# Patient Record
Sex: Male | Born: 1940 | Race: White | Hispanic: No | Marital: Single | Smoking: Former smoker
Health system: Southern US, Community
[De-identification: ages and names within clinical notes are randomized; demographics above are authoritative.]

## PROBLEM LIST (undated history)

## (undated) DIAGNOSIS — L719 Rosacea, unspecified: Secondary | ICD-10-CM

## (undated) DIAGNOSIS — C439 Malignant melanoma of skin, unspecified: Secondary | ICD-10-CM

## (undated) DIAGNOSIS — E7800 Pure hypercholesterolemia, unspecified: Secondary | ICD-10-CM

## (undated) DIAGNOSIS — M1990 Unspecified osteoarthritis, unspecified site: Secondary | ICD-10-CM

## (undated) DIAGNOSIS — I2510 Atherosclerotic heart disease of native coronary artery without angina pectoris: Secondary | ICD-10-CM

## (undated) HISTORY — PX: COLONOSCOPY: SHX174

## (undated) HISTORY — DX: Malignant melanoma of skin, unspecified: C43.9

## (undated) HISTORY — PX: OTHER SURGICAL HISTORY: SHX169

## (undated) HISTORY — PX: TONSILLECTOMY: SUR1361

## (undated) HISTORY — DX: Atherosclerotic heart disease of native coronary artery without angina pectoris: I25.10

## (undated) HISTORY — DX: Pure hypercholesterolemia, unspecified: E78.00

## (undated) HISTORY — DX: Unspecified osteoarthritis, unspecified site: M19.90

## (undated) HISTORY — DX: Rosacea, unspecified: L71.9

---

## 1998-05-20 HISTORY — PX: CORONARY ANGIOPLASTY WITH STENT PLACEMENT: SHX49

## 1998-11-17 ENCOUNTER — Observation Stay (HOSPITAL_COMMUNITY): Admission: AD | Admit: 1998-11-17 | Discharge: 1998-11-18 | Payer: Self-pay | Admitting: Cardiology

## 1998-12-19 ENCOUNTER — Encounter (HOSPITAL_COMMUNITY): Admission: RE | Admit: 1998-12-19 | Discharge: 1999-03-19 | Payer: Self-pay | Admitting: Cardiology

## 1999-08-10 ENCOUNTER — Encounter: Payer: Self-pay | Admitting: Family Medicine

## 1999-08-10 ENCOUNTER — Encounter: Admission: RE | Admit: 1999-08-10 | Discharge: 1999-08-10 | Payer: Self-pay | Admitting: Family Medicine

## 2001-08-08 ENCOUNTER — Inpatient Hospital Stay (HOSPITAL_COMMUNITY): Admission: EM | Admit: 2001-08-08 | Discharge: 2001-08-11 | Payer: Self-pay

## 2001-08-11 ENCOUNTER — Encounter: Payer: Self-pay | Admitting: Cardiology

## 2001-09-08 ENCOUNTER — Encounter: Admission: RE | Admit: 2001-09-08 | Discharge: 2001-09-08 | Payer: Self-pay | Admitting: Family Medicine

## 2001-09-08 ENCOUNTER — Encounter: Payer: Self-pay | Admitting: Family Medicine

## 2007-05-21 DIAGNOSIS — C439 Malignant melanoma of skin, unspecified: Secondary | ICD-10-CM

## 2007-05-21 HISTORY — DX: Malignant melanoma of skin, unspecified: C43.9

## 2011-06-24 DIAGNOSIS — D485 Neoplasm of uncertain behavior of skin: Secondary | ICD-10-CM | POA: Diagnosis not present

## 2011-06-24 DIAGNOSIS — L570 Actinic keratosis: Secondary | ICD-10-CM | POA: Diagnosis not present

## 2011-06-24 DIAGNOSIS — D239 Other benign neoplasm of skin, unspecified: Secondary | ICD-10-CM | POA: Diagnosis not present

## 2011-06-24 DIAGNOSIS — D2360 Other benign neoplasm of skin of unspecified upper limb, including shoulder: Secondary | ICD-10-CM | POA: Diagnosis not present

## 2011-06-24 DIAGNOSIS — Z85820 Personal history of malignant melanoma of skin: Secondary | ICD-10-CM | POA: Diagnosis not present

## 2011-11-18 DIAGNOSIS — Z87442 Personal history of urinary calculi: Secondary | ICD-10-CM | POA: Diagnosis not present

## 2011-11-18 DIAGNOSIS — N529 Male erectile dysfunction, unspecified: Secondary | ICD-10-CM | POA: Diagnosis not present

## 2011-11-18 DIAGNOSIS — N401 Enlarged prostate with lower urinary tract symptoms: Secondary | ICD-10-CM | POA: Diagnosis not present

## 2011-11-18 DIAGNOSIS — N486 Induration penis plastica: Secondary | ICD-10-CM | POA: Diagnosis not present

## 2011-12-13 DIAGNOSIS — C44320 Squamous cell carcinoma of skin of unspecified parts of face: Secondary | ICD-10-CM | POA: Diagnosis not present

## 2011-12-13 DIAGNOSIS — D0439 Carcinoma in situ of skin of other parts of face: Secondary | ICD-10-CM | POA: Diagnosis not present

## 2011-12-13 DIAGNOSIS — Z85828 Personal history of other malignant neoplasm of skin: Secondary | ICD-10-CM | POA: Diagnosis not present

## 2011-12-13 DIAGNOSIS — L821 Other seborrheic keratosis: Secondary | ICD-10-CM | POA: Diagnosis not present

## 2011-12-13 DIAGNOSIS — L570 Actinic keratosis: Secondary | ICD-10-CM | POA: Diagnosis not present

## 2011-12-13 DIAGNOSIS — Z85820 Personal history of malignant melanoma of skin: Secondary | ICD-10-CM | POA: Diagnosis not present

## 2012-01-03 DIAGNOSIS — I889 Nonspecific lymphadenitis, unspecified: Secondary | ICD-10-CM | POA: Diagnosis not present

## 2012-01-21 DIAGNOSIS — M25519 Pain in unspecified shoulder: Secondary | ICD-10-CM | POA: Diagnosis not present

## 2012-01-21 DIAGNOSIS — I889 Nonspecific lymphadenitis, unspecified: Secondary | ICD-10-CM | POA: Diagnosis not present

## 2012-01-21 DIAGNOSIS — Z23 Encounter for immunization: Secondary | ICD-10-CM | POA: Diagnosis not present

## 2012-03-16 DIAGNOSIS — E7800 Pure hypercholesterolemia, unspecified: Secondary | ICD-10-CM | POA: Diagnosis not present

## 2012-03-16 DIAGNOSIS — Z79899 Other long term (current) drug therapy: Secondary | ICD-10-CM | POA: Diagnosis not present

## 2012-04-15 DIAGNOSIS — H52209 Unspecified astigmatism, unspecified eye: Secondary | ICD-10-CM | POA: Diagnosis not present

## 2012-04-15 DIAGNOSIS — Z961 Presence of intraocular lens: Secondary | ICD-10-CM | POA: Diagnosis not present

## 2012-04-15 DIAGNOSIS — H2510 Age-related nuclear cataract, unspecified eye: Secondary | ICD-10-CM | POA: Diagnosis not present

## 2012-04-22 DIAGNOSIS — E7800 Pure hypercholesterolemia, unspecified: Secondary | ICD-10-CM | POA: Diagnosis not present

## 2012-04-22 DIAGNOSIS — I2510 Atherosclerotic heart disease of native coronary artery without angina pectoris: Secondary | ICD-10-CM | POA: Diagnosis not present

## 2012-04-22 DIAGNOSIS — Z9861 Coronary angioplasty status: Secondary | ICD-10-CM | POA: Diagnosis not present

## 2012-06-25 DIAGNOSIS — Z85820 Personal history of malignant melanoma of skin: Secondary | ICD-10-CM | POA: Diagnosis not present

## 2012-06-25 DIAGNOSIS — D239 Other benign neoplasm of skin, unspecified: Secondary | ICD-10-CM | POA: Diagnosis not present

## 2012-06-25 DIAGNOSIS — L821 Other seborrheic keratosis: Secondary | ICD-10-CM | POA: Diagnosis not present

## 2012-06-25 DIAGNOSIS — Z85828 Personal history of other malignant neoplasm of skin: Secondary | ICD-10-CM | POA: Diagnosis not present

## 2012-06-25 DIAGNOSIS — L739 Follicular disorder, unspecified: Secondary | ICD-10-CM | POA: Diagnosis not present

## 2012-06-25 DIAGNOSIS — L570 Actinic keratosis: Secondary | ICD-10-CM | POA: Diagnosis not present

## 2012-06-26 DIAGNOSIS — H903 Sensorineural hearing loss, bilateral: Secondary | ICD-10-CM | POA: Diagnosis not present

## 2012-08-18 DIAGNOSIS — Z79899 Other long term (current) drug therapy: Secondary | ICD-10-CM | POA: Diagnosis not present

## 2012-08-18 DIAGNOSIS — G609 Hereditary and idiopathic neuropathy, unspecified: Secondary | ICD-10-CM | POA: Diagnosis not present

## 2012-09-07 DIAGNOSIS — G609 Hereditary and idiopathic neuropathy, unspecified: Secondary | ICD-10-CM | POA: Diagnosis not present

## 2012-09-21 DIAGNOSIS — Z79899 Other long term (current) drug therapy: Secondary | ICD-10-CM | POA: Diagnosis not present

## 2012-09-21 DIAGNOSIS — E7800 Pure hypercholesterolemia, unspecified: Secondary | ICD-10-CM | POA: Diagnosis not present

## 2012-09-23 DIAGNOSIS — Z86010 Personal history of colonic polyps: Secondary | ICD-10-CM | POA: Diagnosis not present

## 2012-09-23 DIAGNOSIS — D126 Benign neoplasm of colon, unspecified: Secondary | ICD-10-CM | POA: Diagnosis not present

## 2012-09-23 DIAGNOSIS — Z09 Encounter for follow-up examination after completed treatment for conditions other than malignant neoplasm: Secondary | ICD-10-CM | POA: Diagnosis not present

## 2012-11-11 DIAGNOSIS — N401 Enlarged prostate with lower urinary tract symptoms: Secondary | ICD-10-CM | POA: Diagnosis not present

## 2012-11-18 DIAGNOSIS — N529 Male erectile dysfunction, unspecified: Secondary | ICD-10-CM | POA: Diagnosis not present

## 2012-11-18 DIAGNOSIS — N401 Enlarged prostate with lower urinary tract symptoms: Secondary | ICD-10-CM | POA: Diagnosis not present

## 2012-11-18 DIAGNOSIS — N486 Induration penis plastica: Secondary | ICD-10-CM | POA: Diagnosis not present

## 2012-12-17 DIAGNOSIS — W57XXXA Bitten or stung by nonvenomous insect and other nonvenomous arthropods, initial encounter: Secondary | ICD-10-CM | POA: Diagnosis not present

## 2012-12-17 DIAGNOSIS — D1801 Hemangioma of skin and subcutaneous tissue: Secondary | ICD-10-CM | POA: Diagnosis not present

## 2012-12-17 DIAGNOSIS — L570 Actinic keratosis: Secondary | ICD-10-CM | POA: Diagnosis not present

## 2012-12-17 DIAGNOSIS — L821 Other seborrheic keratosis: Secondary | ICD-10-CM | POA: Diagnosis not present

## 2012-12-17 DIAGNOSIS — T148 Other injury of unspecified body region: Secondary | ICD-10-CM | POA: Diagnosis not present

## 2012-12-17 DIAGNOSIS — Z85820 Personal history of malignant melanoma of skin: Secondary | ICD-10-CM | POA: Diagnosis not present

## 2013-02-23 DIAGNOSIS — H00019 Hordeolum externum unspecified eye, unspecified eyelid: Secondary | ICD-10-CM | POA: Diagnosis not present

## 2013-03-08 DIAGNOSIS — H0019 Chalazion unspecified eye, unspecified eyelid: Secondary | ICD-10-CM | POA: Diagnosis not present

## 2013-04-14 ENCOUNTER — Telehealth: Payer: Self-pay | Admitting: Cardiology

## 2013-04-14 NOTE — Telephone Encounter (Signed)
Pt calls in today regarding possibility of fasting lab work with his December 1st office visit. Pt states he generally has lab work every 6 months for cholesterol.   I have reviewed pt medications with him.   I have asked him come in fasting for his appointment with Dr. Anne Fu in light of the possibility of fasting labs. (9am)  Randy Red RN

## 2013-04-14 NOTE — Telephone Encounter (Signed)
New message     Pt is coming in Monday for an annual follow up.  He want to know if he needs to have bld wk (cholesterol ck) prior to appt?  He is on cholesterol medication.

## 2013-04-18 ENCOUNTER — Encounter: Payer: Self-pay | Admitting: Cardiology

## 2013-04-18 DIAGNOSIS — I2510 Atherosclerotic heart disease of native coronary artery without angina pectoris: Secondary | ICD-10-CM | POA: Insufficient documentation

## 2013-04-18 DIAGNOSIS — E7800 Pure hypercholesterolemia, unspecified: Secondary | ICD-10-CM | POA: Insufficient documentation

## 2013-04-19 ENCOUNTER — Ambulatory Visit (INDEPENDENT_AMBULATORY_CARE_PROVIDER_SITE_OTHER): Payer: Medicare Other | Admitting: Cardiology

## 2013-04-19 ENCOUNTER — Encounter: Payer: Self-pay | Admitting: Cardiology

## 2013-04-19 VITALS — BP 120/86 | HR 55 | Ht 70.0 in | Wt 202.0 lb

## 2013-04-19 DIAGNOSIS — E7800 Pure hypercholesterolemia, unspecified: Secondary | ICD-10-CM | POA: Diagnosis not present

## 2013-04-19 DIAGNOSIS — I208 Other forms of angina pectoris: Secondary | ICD-10-CM | POA: Insufficient documentation

## 2013-04-19 DIAGNOSIS — I2510 Atherosclerotic heart disease of native coronary artery without angina pectoris: Secondary | ICD-10-CM | POA: Diagnosis not present

## 2013-04-19 NOTE — Patient Instructions (Signed)
Your physician wants you to follow-up in:   YEAR WITH  DR SKAINS  You will receive a reminder letter in the mail two months in advance. If you don't receive a letter, please call our office to schedule the follow-up appointment. Your physician recommends that you continue on your current medications as directed. Please refer to the Current Medication list given to you today.  

## 2013-04-19 NOTE — Progress Notes (Signed)
[address], [address] [place], [place]  [address] Phone: [phone] Fax:  [phone]  Date:  04/19/2013   ID:  Randy [name], DOB [birth year], MRN [id number]  PCP:  Lupe Carney, MD   History of Present Illness: Randy [name] is a 72 y.o. male with coronary artery disease, bare-metal stent to LAD in 2000, cath in 2003 showed patent stent with hyperlipidemia. Former patient of Dr. Amil Amen. Last stress test in system 2007 normal EF, no ischemia. Feels the best now than he does in years, decreased SOB with stairs. Retired doing well.   CAD-prior symptoms were dyspnea on exertion. He is not experiencing any of these symptoms. He is doing well. Retired. No NTG. Has occasional palpitations. No syncope. Quick burst. Rare vision blurry about 30 seconds. Sat down and resolved. Working on rental houses.   Hyperlipidemia-excellent lipid control on Lipitor and Zetia. We will continue with current dosing    Wt Readings from Last 3 Encounters:  04/19/13 202 lb (91.627 kg)     Past Medical History  Diagnosis Date  . Hypercholesterolemia   . CAD (coronary artery disease)   . Rosacea   . Arthritis   . Melanoma     Past Surgical History  Procedure Laterality Date  . Tonsillectomy    . Coronary angioplasty with stent placement    . Left temporal melanoma    . Colonoscopy      Current Outpatient Prescriptions  Medication Sig Dispense Refill  . aspirin 325 MG tablet Take 325 mg by mouth daily.      Marland Kitchen atorvastatin (LIPITOR) 20 MG tablet Take 20 mg by mouth daily.      . clindamycin (CLINDAGEL) 1 % gel Apply 1 application topically 2 (two) times daily.      Marland Kitchen ezetimibe (ZETIA) 10 MG tablet Take 10 mg by mouth daily.      . isosorbide mononitrate (IMDUR) 30 MG 24 hr tablet Take 30 mg by mouth daily.      . meloxicam (MOBIC) 15 MG tablet Take 15 mg by mouth as needed for pain.      . Multiple Vitamins-Minerals (CENTRUM CARDIO) TABS Take 2 tablets by mouth daily.       .  nitroGLYCERIN (NITROSTAT) 0.4 MG SL tablet Place 0.4 mg under the tongue every 5 (five) minutes as needed for chest pain.      . Omega-3 Fatty Acids (FISH OIL) 1000 MG CAPS Take by mouth 2 (two) times daily.      . vitamin E 400 UNIT capsule Take 400 Units by mouth daily.       No current facility-administered medications for this visit.    Allergies:   No Known Allergies  Social History:  The patient  reports that he has quit smoking. He does not have any smokeless tobacco history on file. He reports that he does not drink alcohol or use illicit drugs.   ROS:  Please see the history of present illness.   Denies any fevers, syncope, orthopnea, PND  PHYSICAL EXAM: VS:  BP 120/86  Pulse 55  Ht 5\' 10"  (1.778 m)  Wt 202 lb (91.627 kg)  BMI 28.98 kg/m2  SpO2 96% Well nourished, well developed, in no acute distress HEENT: normal Neck: no JVD Cardiac:  normal S1, S2; RRR; no murmur Lungs:  clear to auscultation bilaterally, no wheezing, rhonchi or rales Abd: soft, nontender, no hepatomegaly Ext: no edema Skin: warm and dry Neuro: no focal abnormalities  noted  EKG:  Sinus bradycardia rate 55, he borderline LVH, borderline intraventricular conduction delay     ASSESSMENT AND PLAN:  1. Coronary artery disease-currently doing well without any symptoms. No anginal symptoms. Doing very well. Very active. Coronary anatomy reviewed as above. 2. Hyperlipidemia-under excellent control. Continue with current medication. Last lab work 5/14-LDL 75. Continuing combination therapy. We will check this in one year unless it is done by Dr. Clovis Riley sooner. 3. Angina-under good control. No dyspnea on exertion which was his anginal equivalent. He did have occasional blurry vision which subsided when sitting down. Perhaps orthostasis. Continue to monitor.  Signed, Donato Schultz, MD [facility]  04/19/2013 9:24 AM

## 2013-04-27 DIAGNOSIS — H524 Presbyopia: Secondary | ICD-10-CM | POA: Diagnosis not present

## 2013-04-27 DIAGNOSIS — Z961 Presence of intraocular lens: Secondary | ICD-10-CM | POA: Diagnosis not present

## 2013-04-27 DIAGNOSIS — H0019 Chalazion unspecified eye, unspecified eyelid: Secondary | ICD-10-CM | POA: Diagnosis not present

## 2013-04-27 DIAGNOSIS — H2640 Unspecified secondary cataract: Secondary | ICD-10-CM | POA: Diagnosis not present

## 2013-05-06 DIAGNOSIS — H26499 Other secondary cataract, unspecified eye: Secondary | ICD-10-CM | POA: Diagnosis not present

## 2013-05-06 DIAGNOSIS — H2640 Unspecified secondary cataract: Secondary | ICD-10-CM | POA: Diagnosis not present

## 2013-06-01 ENCOUNTER — Other Ambulatory Visit: Payer: Self-pay

## 2013-06-01 MED ORDER — ATORVASTATIN CALCIUM 20 MG PO TABS: 20.0000 mg | ORAL_TABLET | Freq: Every day | ORAL | Status: DC

## 2013-06-01 MED ORDER — ISOSORBIDE MONONITRATE ER 30 MG PO TB24: 30.0000 mg | ORAL_TABLET | Freq: Every day | ORAL | Status: DC

## 2013-06-02 ENCOUNTER — Other Ambulatory Visit: Payer: Self-pay

## 2013-06-02 MED ORDER — ATORVASTATIN CALCIUM 20 MG PO TABS: 20.0000 mg | ORAL_TABLET | Freq: Every day | ORAL | Status: DC

## 2013-06-02 MED ORDER — ISOSORBIDE MONONITRATE ER 30 MG PO TB24: 30.0000 mg | ORAL_TABLET | Freq: Every day | ORAL | Status: DC

## 2013-06-17 DIAGNOSIS — B070 Plantar wart: Secondary | ICD-10-CM | POA: Diagnosis not present

## 2013-06-17 DIAGNOSIS — Z85820 Personal history of malignant melanoma of skin: Secondary | ICD-10-CM | POA: Diagnosis not present

## 2013-06-17 DIAGNOSIS — L821 Other seborrheic keratosis: Secondary | ICD-10-CM | POA: Diagnosis not present

## 2013-06-17 DIAGNOSIS — D1801 Hemangioma of skin and subcutaneous tissue: Secondary | ICD-10-CM | POA: Diagnosis not present

## 2013-06-17 DIAGNOSIS — L820 Inflamed seborrheic keratosis: Secondary | ICD-10-CM | POA: Diagnosis not present

## 2013-06-17 DIAGNOSIS — L570 Actinic keratosis: Secondary | ICD-10-CM | POA: Diagnosis not present

## 2013-06-17 DIAGNOSIS — L819 Disorder of pigmentation, unspecified: Secondary | ICD-10-CM | POA: Diagnosis not present

## 2013-07-05 ENCOUNTER — Telehealth: Payer: Self-pay | Admitting: Cardiology

## 2013-07-05 ENCOUNTER — Other Ambulatory Visit: Payer: Self-pay

## 2013-07-05 DIAGNOSIS — R42 Dizziness and giddiness: Secondary | ICD-10-CM | POA: Diagnosis not present

## 2013-07-05 MED ORDER — EZETIMIBE 10 MG PO TABS: 10.0000 mg | ORAL_TABLET | Freq: Every day | ORAL | Status: DC

## 2013-07-05 NOTE — Telephone Encounter (Signed)
Forwarding  to Dr. Marlou Porch

## 2013-07-05 NOTE — Telephone Encounter (Signed)
New problem   Need to know if pt can have a MRI pt have ptca w/stent. Pt need MRI no contrast or CT w/contrast/ Dr Alroy Dust prefer MRI.Please advise.

## 2013-07-07 ENCOUNTER — Other Ambulatory Visit: Payer: Self-pay | Admitting: Cardiology

## 2013-07-07 DIAGNOSIS — E7800 Pure hypercholesterolemia, unspecified: Secondary | ICD-10-CM

## 2013-07-07 DIAGNOSIS — I2510 Atherosclerotic heart disease of native coronary artery without angina pectoris: Secondary | ICD-10-CM

## 2013-07-07 NOTE — Telephone Encounter (Signed)
It is okay to proceed with MRI following stent.

## 2013-07-07 NOTE — Telephone Encounter (Signed)
Spoke with patient advised that i would get more clarity from Dr. Marlou Porch regarding MRI

## 2013-07-12 NOTE — Telephone Encounter (Signed)
Follow up   Dr [name] Ramona  office needs a call back confirming Pt can have an MRI.   Please them. # [phone]

## 2013-07-16 ENCOUNTER — Encounter: Payer: Self-pay | Admitting: Cardiology

## 2013-07-16 NOTE — Telephone Encounter (Signed)
Clearance faxed to Dr. Alroy Dust office.

## 2013-07-19 ENCOUNTER — Other Ambulatory Visit: Payer: Self-pay | Admitting: Family Medicine

## 2013-07-19 DIAGNOSIS — R42 Dizziness and giddiness: Secondary | ICD-10-CM

## 2013-07-21 ENCOUNTER — Other Ambulatory Visit (INDEPENDENT_AMBULATORY_CARE_PROVIDER_SITE_OTHER): Payer: Medicare Other

## 2013-07-21 DIAGNOSIS — I2510 Atherosclerotic heart disease of native coronary artery without angina pectoris: Secondary | ICD-10-CM

## 2013-07-21 DIAGNOSIS — E7800 Pure hypercholesterolemia, unspecified: Secondary | ICD-10-CM | POA: Diagnosis not present

## 2013-07-21 LAB — BASIC METABOLIC PANEL
BUN: 16 mg/dL (ref 6–23)
CALCIUM: 9.4 mg/dL (ref 8.4–10.5)
CO2: 27 mEq/L (ref 19–32)
CREATININE: 1 mg/dL (ref 0.4–1.5)
Chloride: 103 mEq/L (ref 96–112)
GFR: 77.22 mL/min (ref >60.00)
GLUCOSE: 82 mg/dL (ref 70–99)
Potassium: 4.5 mEq/L (ref 3.5–5.1)
Sodium: 137 mEq/L (ref 135–145)

## 2013-07-24 ENCOUNTER — Ambulatory Visit
Admission: RE | Admit: 2013-07-24 | Discharge: 2013-07-24 | Disposition: A | Discharge status: Home / Self Care | Payer: Medicare Other | Source: Ambulatory Visit | Attending: Family Medicine | Admitting: Family Medicine

## 2013-07-24 DIAGNOSIS — H538 Other visual disturbances: Secondary | ICD-10-CM | POA: Diagnosis not present

## 2013-07-24 DIAGNOSIS — R42 Dizziness and giddiness: Secondary | ICD-10-CM

## 2013-08-05 ENCOUNTER — Telehealth: Payer: Self-pay | Admitting: Cardiology

## 2013-08-05 NOTE — Telephone Encounter (Signed)
Spoke with pt, aware of lab results. 

## 2013-08-05 NOTE — Telephone Encounter (Signed)
New message ° ° ° ° °Want lab results °

## 2013-12-24 DIAGNOSIS — L570 Actinic keratosis: Secondary | ICD-10-CM | POA: Diagnosis not present

## 2013-12-24 DIAGNOSIS — N401 Enlarged prostate with lower urinary tract symptoms: Secondary | ICD-10-CM | POA: Diagnosis not present

## 2013-12-24 DIAGNOSIS — L821 Other seborrheic keratosis: Secondary | ICD-10-CM | POA: Diagnosis not present

## 2013-12-24 DIAGNOSIS — N138 Other obstructive and reflux uropathy: Secondary | ICD-10-CM | POA: Diagnosis not present

## 2013-12-24 DIAGNOSIS — N139 Obstructive and reflux uropathy, unspecified: Secondary | ICD-10-CM | POA: Diagnosis not present

## 2013-12-29 DIAGNOSIS — N529 Male erectile dysfunction, unspecified: Secondary | ICD-10-CM | POA: Diagnosis not present

## 2013-12-29 DIAGNOSIS — N401 Enlarged prostate with lower urinary tract symptoms: Secondary | ICD-10-CM | POA: Diagnosis not present

## 2013-12-29 DIAGNOSIS — N139 Obstructive and reflux uropathy, unspecified: Secondary | ICD-10-CM | POA: Diagnosis not present

## 2013-12-29 DIAGNOSIS — N138 Other obstructive and reflux uropathy: Secondary | ICD-10-CM | POA: Diagnosis not present

## 2013-12-29 DIAGNOSIS — N486 Induration penis plastica: Secondary | ICD-10-CM | POA: Diagnosis not present

## 2014-01-04 DIAGNOSIS — Z23 Encounter for immunization: Secondary | ICD-10-CM | POA: Diagnosis not present

## 2014-01-05 ENCOUNTER — Other Ambulatory Visit: Payer: Self-pay | Admitting: *Deleted

## 2014-01-05 MED ORDER — NITROGLYCERIN 0.4 MG SL SUBL: 0.4000 mg | SUBLINGUAL_TABLET | SUBLINGUAL | Status: DC | PRN

## 2014-01-07 DIAGNOSIS — D1801 Hemangioma of skin and subcutaneous tissue: Secondary | ICD-10-CM | POA: Diagnosis not present

## 2014-01-07 DIAGNOSIS — L821 Other seborrheic keratosis: Secondary | ICD-10-CM | POA: Diagnosis not present

## 2014-01-07 DIAGNOSIS — L570 Actinic keratosis: Secondary | ICD-10-CM | POA: Diagnosis not present

## 2014-02-01 ENCOUNTER — Other Ambulatory Visit: Payer: Self-pay

## 2014-02-01 MED ORDER — EZETIMIBE 10 MG PO TABS: 10.0000 mg | ORAL_TABLET | Freq: Every day | ORAL | Status: DC

## 2014-03-07 ENCOUNTER — Other Ambulatory Visit: Payer: Self-pay | Admitting: *Deleted

## 2014-03-07 MED ORDER — ISOSORBIDE MONONITRATE ER 30 MG PO TB24: 30.0000 mg | ORAL_TABLET | Freq: Every day | ORAL | Status: DC

## 2014-04-19 ENCOUNTER — Ambulatory Visit (INDEPENDENT_AMBULATORY_CARE_PROVIDER_SITE_OTHER): Payer: Medicare Other | Admitting: Cardiology

## 2014-04-19 ENCOUNTER — Encounter: Payer: Self-pay | Admitting: Cardiology

## 2014-04-19 VITALS — BP 118/88 | HR 64 | Ht 70.0 in | Wt 201.0 lb

## 2014-04-19 DIAGNOSIS — I208 Other forms of angina pectoris: Secondary | ICD-10-CM | POA: Diagnosis not present

## 2014-04-19 DIAGNOSIS — E7800 Pure hypercholesterolemia, unspecified: Secondary | ICD-10-CM

## 2014-04-19 DIAGNOSIS — Z79899 Other long term (current) drug therapy: Secondary | ICD-10-CM

## 2014-04-19 DIAGNOSIS — R42 Dizziness and giddiness: Secondary | ICD-10-CM | POA: Diagnosis not present

## 2014-04-19 DIAGNOSIS — I2510 Atherosclerotic heart disease of native coronary artery without angina pectoris: Secondary | ICD-10-CM

## 2014-04-19 DIAGNOSIS — I2583 Coronary atherosclerosis due to lipid rich plaque: Principal | ICD-10-CM

## 2014-04-19 NOTE — Patient Instructions (Signed)
Please stop your Imdur. Continue all other medications as listed.  Have blood work today (Lipid and ALT)  Follow up in 1 year with Dr. Marlou Porch.  You will receive a letter in the mail 2 months before you are due.  Please call us when you receive this letter to schedule your follow up appointment.  Thank you for choosing [facility]!!

## 2014-04-19 NOTE — Progress Notes (Signed)
[facility] [place], [place]  [address] Phone: [phone] Fax:  [phone]  Date:  04/19/2014   ID:  Randy [name], DOB Oct 01, 1940, MRN [id number]  PCP:  Donnie Coffin, MD   History of Present Illness: Randy [name] is a 73 y.o. male with coronary artery disease, bare-metal stent to LAD in 2000, cath in 2003 showed patent stent with hyperlipidemia. Former patient of Dr. Leonia Reeves. Last stress test in system 2007 normal EF, no ischemia. Feels the best now than he does in years, decreased SOB with stairs. Retired doing well. Dizziness blurry vision, MRI normal.   CAD-prior symptoms were dyspnea on exertion. He is not experiencing any of these symptoms. He is doing well. Retired. No NTG. Has occasional palpitations. No syncope. Quick burst. Rare vision blurry about 30 seconds. Sat down and resolved. Working on [employer], does his own floors.   Hyperlipidemia-excellent lipid control previously on Lipitor and Zetia. We will continue with current dosing    Wt Readings from Last 3 Encounters:  04/19/14 201 lb (91.173 kg)  04/19/13 202 lb (91.627 kg)     Past Medical History  Diagnosis Date  . Hypercholesterolemia   . CAD (coronary artery disease)   . Rosacea   . Arthritis   . Melanoma     Past Surgical History  Procedure Laterality Date  . Tonsillectomy    . Coronary angioplasty with stent placement    . Left temporal melanoma    . Colonoscopy      Current Outpatient Prescriptions  Medication Sig Dispense Refill  . aspirin 325 MG tablet Take 325 mg by mouth daily.    Marland Kitchen atorvastatin (LIPITOR) 20 MG tablet Take 1 tablet (20 mg total) by mouth daily. 30 tablet 11  . clindamycin (CLINDAGEL) 1 % gel Apply 1 application topically 2 (two) times daily.    Marland Kitchen ezetimibe (ZETIA) 10 MG tablet Take 1 tablet (10 mg total) by mouth daily. 30 tablet 5  . isosorbide mononitrate (IMDUR) 30 MG 24 hr tablet Take 1 tablet (30 mg total) by mouth daily. 30 tablet 3  .  meloxicam (MOBIC) 15 MG tablet Take 15 mg by mouth as needed for pain.    . Multiple Vitamins-Minerals (CENTRUM CARDIO) TABS Take 2 tablets by mouth daily.     . nitroGLYCERIN (NITROSTAT) 0.4 MG SL tablet Place 1 tablet (0.4 mg total) under the tongue every 5 (five) minutes as needed for chest pain. 25 tablet 3   No current facility-administered medications for this visit.    Allergies:   No Known Allergies  Social History:  The patient  reports that he has quit smoking. He does not have any smokeless tobacco history on file. He reports that he does not drink alcohol or use illicit drugs.   ROS:  Please see the history of present illness.   Denies any fevers, syncope, orthopnea, PND  PHYSICAL EXAM: VS:  BP 118/88 mmHg  Pulse 64  Ht 5\' 10"  (1.778 m)  Wt 201 lb (91.173 kg)  BMI 28.84 kg/m2 Well nourished, well developed, in no acute distress HEENT: normal Neck: no JVD Cardiac:  normal S1, S2; RRR; no murmur Lungs:  clear to auscultation bilaterally, no wheezing, rhonchi or rales Abd: soft, nontender, no hepatomegaly Ext: no edema Skin: warm and dry Neuro: no focal abnormalities noted  EKG:   04/19/14-sinus rhythm, 64, left axis deviation, no other abnormalities Sinus bradycardia rate 55, he borderline LVH, borderline intraventricular conduction  delay     Labs: 07/21/13-creatinine 1.0, potassium 4.5  MRI: 07/24/13-mild small vessel disease brain.  ASSESSMENT AND PLAN:  1. Coronary artery disease-currently doing well without any symptoms. No anginal symptoms. Doing very well. Very active. Coronary anatomy reviewed as above. He is having some dizziness/blurry vision at times. He believes that this may be secondary to his vasodilator, isosorbide. We will go ahead and discontinue to see how he does. 2. Hyperlipidemia-under excellent control. Continue with current medication. Last lab work 5/14-LDL 72. Continuing combination therapy. Checking lipid profile, ALT. 3. Angina-under good  control. No dyspnea on exertion which was his anginal equivalent. He did have occasional blurry vision which subsided when sitting down. Perhaps orthostasis. Perhaps isosorbide playing a role. We will discontinue and see how he does. Continue to monitor. 4. One-year follow-up.  Signed, Candee Furbish, MD [facility]  04/19/2014 9:06 AM

## 2014-04-20 LAB — LIPID PANEL
CHOL/HDL RATIO: 3
Cholesterol: 123 mg/dL (ref 0–200)
HDL: 36.9 mg/dL — AB (ref >39.00)
LDL Cholesterol: 69 mg/dL (ref 0–99)
NONHDL: 86.1
Triglycerides: 86 mg/dL (ref 0.0–149.0)
VLDL: 17.2 mg/dL (ref 0.0–40.0)

## 2014-04-20 LAB — ALT: ALT: 29 U/L (ref 0–53)

## 2014-05-17 ENCOUNTER — Telehealth: Payer: Self-pay | Admitting: Cardiology

## 2014-05-17 DIAGNOSIS — H43813 Vitreous degeneration, bilateral: Secondary | ICD-10-CM | POA: Diagnosis not present

## 2014-05-17 DIAGNOSIS — H33012 Retinal detachment with single break, left eye: Secondary | ICD-10-CM | POA: Diagnosis not present

## 2014-05-17 NOTE — Telephone Encounter (Signed)
New message      Pt has a detached retina and he is going to have surgery tomorrow.  He needs to be cleared.  Please call

## 2014-05-17 NOTE — Telephone Encounter (Signed)
OK to proceed with retinal surgery from cardiac standpoint.  Candee Furbish, MD

## 2014-05-17 NOTE — Telephone Encounter (Signed)
Pt has a large retinal detachment that needs to be repaired.  He is scheduled for tomorrow at [facility].  He is to have a nerve block for surgery and not general anesthesia.  Aware I will contact Dr Marlou Porch for clearance.  She would like it faxed to [phone].

## 2014-05-17 NOTE — Telephone Encounter (Signed)
Will fax clearance as requested.

## 2014-05-18 ENCOUNTER — Ambulatory Visit: Payer: Self-pay | Admitting: Ophthalmology

## 2014-05-18 DIAGNOSIS — I2510 Atherosclerotic heart disease of native coronary artery without angina pectoris: Secondary | ICD-10-CM | POA: Diagnosis not present

## 2014-05-18 DIAGNOSIS — H33022 Retinal detachment with multiple breaks, left eye: Secondary | ICD-10-CM | POA: Diagnosis not present

## 2014-05-18 DIAGNOSIS — Z79899 Other long term (current) drug therapy: Secondary | ICD-10-CM | POA: Diagnosis not present

## 2014-05-18 DIAGNOSIS — Z85820 Personal history of malignant melanoma of skin: Secondary | ICD-10-CM | POA: Diagnosis not present

## 2014-05-18 DIAGNOSIS — Z7982 Long term (current) use of aspirin: Secondary | ICD-10-CM | POA: Diagnosis not present

## 2014-05-18 DIAGNOSIS — H9190 Unspecified hearing loss, unspecified ear: Secondary | ICD-10-CM | POA: Diagnosis not present

## 2014-05-18 DIAGNOSIS — Z955 Presence of coronary angioplasty implant and graft: Secondary | ICD-10-CM | POA: Diagnosis not present

## 2014-06-16 DIAGNOSIS — H33022 Retinal detachment with multiple breaks, left eye: Secondary | ICD-10-CM | POA: Diagnosis not present

## 2014-07-12 DIAGNOSIS — L821 Other seborrheic keratosis: Secondary | ICD-10-CM | POA: Diagnosis not present

## 2014-07-12 DIAGNOSIS — L570 Actinic keratosis: Secondary | ICD-10-CM | POA: Diagnosis not present

## 2014-07-12 DIAGNOSIS — D1801 Hemangioma of skin and subcutaneous tissue: Secondary | ICD-10-CM | POA: Diagnosis not present

## 2014-07-12 DIAGNOSIS — Z85820 Personal history of malignant melanoma of skin: Secondary | ICD-10-CM | POA: Diagnosis not present

## 2014-07-12 DIAGNOSIS — L853 Xerosis cutis: Secondary | ICD-10-CM | POA: Diagnosis not present

## 2014-07-28 ENCOUNTER — Other Ambulatory Visit: Payer: Self-pay

## 2014-07-28 MED ORDER — ATORVASTATIN CALCIUM 20 MG PO TABS: 20.0000 mg | ORAL_TABLET | Freq: Every day | ORAL | Status: DC

## 2014-08-09 DIAGNOSIS — H33022 Retinal detachment with multiple breaks, left eye: Secondary | ICD-10-CM | POA: Diagnosis not present

## 2014-08-21 ENCOUNTER — Other Ambulatory Visit: Payer: Self-pay | Admitting: Cardiology

## 2014-08-26 ENCOUNTER — Other Ambulatory Visit: Payer: Self-pay | Admitting: Cardiology

## 2014-09-08 DIAGNOSIS — H33022 Retinal detachment with multiple breaks, left eye: Secondary | ICD-10-CM | POA: Diagnosis not present

## 2014-09-14 NOTE — Op Note (Signed)
PATIENT NAME:  Randy, [name] MR#:  [id number] DATE OF BIRTH:  July 03, 1940  DATE OF PROCEDURE:  05/18/2014  PREOPERATIVE DIAGNOSIS: Retinal detachment, left eye.   POSTOPERATIVE DIAGNOSIS:  Retinal detachment, left eye.  PROCEDURE: A 25-gauge pars plana vitrectomy with endolaser air-fluid exchange with 28% SF6 in the left eye.   COMPLICATIONS: None.   BLOOD LOSS: Minimal.   ANESTHESIA: Monitored anesthesia care with peribulbar block.   INDICATION FOR PROCEDURE: The  patient was examined the previous day in the clinic and found to have a macula-on retinal detachment in the left eye. Risks, benefits, alternatives, and complications were discussed with the patient, and the patient elected to proceed with pars plana vitrectomy with retinal detachment repair in the left eye.   DESCRIPTION OF PROCEDURE: On the day of surgery, the patient was greeted in the preoperative holding area. Any questions were answered. The left eye was marked. The patient was brought into the Operating Room and placed under monitored anesthesia care. A peribulbar block consisting of lidocaine and Marcaine plain with Wydase was given. The patient's left eye was then prepped and draped in the usual sterile fashion. The 25-gauge trocars were then used and placed in the usual position. The infusion cannula was checked to ensure it was in the vitreous cavity prior to starting the infusion. A core vitrectomy was performed. Attention was drawn to the superior tear. The vitreous was removed from around the edges of the tear and then 360 degrees shortened. The tear was then marked 360 degrees. Scleral depression revealed another smaller tear at 3 o'clock, close to the ora and the meridional complex. The drain retinotomy was then made in the meridian of the original tear at 12 o'clock. The subretinal fluid was drained. An air-fluid exchange was performed and the retina was drained flat. There was laser applied around the drain and around the  original break, and also the break a 3 o'clock. The  remainder of the fluid was then drained again. Four times the vitreous volume of 28% SF6 was infused into the vitreous. The trocars were then removed, and the wounds were felt to be watertight, after suturing 1 of them. The pressure was acceptable by palpation.   Subconjunctival cefuroxime and dexamethasone were given. The eye was then patched and shielded. The patient was taken to the recovery area in stable condition.    ____________________________ Laban Emperor Oval Linsey, MD jdr:MT D: 05/18/2014 13:30:21 ET T: 05/18/2014 15:19:02 ET JOB#: [id number]  cc: Janett Billow D. Oval Linsey, MD, <Dictator> Storm Sovine D [facility] MD ELECTRONICALLY SIGNED 05/25/2014 11:03

## 2014-10-18 DIAGNOSIS — H35372 Puckering of macula, left eye: Secondary | ICD-10-CM | POA: Diagnosis not present

## 2014-10-18 DIAGNOSIS — H33022 Retinal detachment with multiple breaks, left eye: Secondary | ICD-10-CM | POA: Diagnosis not present

## 2014-12-26 ENCOUNTER — Other Ambulatory Visit: Payer: Self-pay | Admitting: Family Medicine

## 2014-12-26 DIAGNOSIS — I2510 Atherosclerotic heart disease of native coronary artery without angina pectoris: Secondary | ICD-10-CM | POA: Diagnosis not present

## 2014-12-26 DIAGNOSIS — E780 Pure hypercholesterolemia: Secondary | ICD-10-CM | POA: Diagnosis not present

## 2014-12-26 DIAGNOSIS — H538 Other visual disturbances: Secondary | ICD-10-CM | POA: Diagnosis not present

## 2014-12-26 DIAGNOSIS — Z0000 Encounter for general adult medical examination without abnormal findings: Secondary | ICD-10-CM | POA: Diagnosis not present

## 2015-01-02 ENCOUNTER — Ambulatory Visit
Admission: RE | Admit: 2015-01-02 | Discharge: 2015-01-02 | Disposition: A | Discharge status: Home / Self Care | Payer: Medicare Other | Source: Ambulatory Visit | Attending: Family Medicine | Admitting: Family Medicine

## 2015-01-02 DIAGNOSIS — H538 Other visual disturbances: Secondary | ICD-10-CM

## 2015-01-02 DIAGNOSIS — I6523 Occlusion and stenosis of bilateral carotid arteries: Secondary | ICD-10-CM | POA: Diagnosis not present

## 2015-01-09 DIAGNOSIS — L820 Inflamed seborrheic keratosis: Secondary | ICD-10-CM | POA: Diagnosis not present

## 2015-01-09 DIAGNOSIS — D692 Other nonthrombocytopenic purpura: Secondary | ICD-10-CM | POA: Diagnosis not present

## 2015-01-09 DIAGNOSIS — L821 Other seborrheic keratosis: Secondary | ICD-10-CM | POA: Diagnosis not present

## 2015-01-09 DIAGNOSIS — N401 Enlarged prostate with lower urinary tract symptoms: Secondary | ICD-10-CM | POA: Diagnosis not present

## 2015-01-09 DIAGNOSIS — Z85820 Personal history of malignant melanoma of skin: Secondary | ICD-10-CM | POA: Diagnosis not present

## 2015-01-09 DIAGNOSIS — L570 Actinic keratosis: Secondary | ICD-10-CM | POA: Diagnosis not present

## 2015-01-09 DIAGNOSIS — D1801 Hemangioma of skin and subcutaneous tissue: Secondary | ICD-10-CM | POA: Diagnosis not present

## 2015-01-11 DIAGNOSIS — N5201 Erectile dysfunction due to arterial insufficiency: Secondary | ICD-10-CM | POA: Diagnosis not present

## 2015-01-11 DIAGNOSIS — N486 Induration penis plastica: Secondary | ICD-10-CM | POA: Diagnosis not present

## 2015-01-11 DIAGNOSIS — R351 Nocturia: Secondary | ICD-10-CM | POA: Diagnosis not present

## 2015-01-11 DIAGNOSIS — N401 Enlarged prostate with lower urinary tract symptoms: Secondary | ICD-10-CM | POA: Diagnosis not present

## 2015-01-11 DIAGNOSIS — N138 Other obstructive and reflux uropathy: Secondary | ICD-10-CM | POA: Diagnosis not present

## 2015-01-11 DIAGNOSIS — Z87442 Personal history of urinary calculi: Secondary | ICD-10-CM | POA: Diagnosis not present

## 2015-01-17 DIAGNOSIS — Z23 Encounter for immunization: Secondary | ICD-10-CM | POA: Diagnosis not present

## 2015-02-26 ENCOUNTER — Other Ambulatory Visit: Payer: Self-pay | Admitting: Cardiology

## 2015-03-01 ENCOUNTER — Encounter: Payer: Self-pay | Admitting: Cardiology

## 2015-03-01 ENCOUNTER — Telehealth: Payer: Self-pay | Admitting: Cardiology

## 2015-03-01 NOTE — Telephone Encounter (Signed)
Patient's isosobide was discontinue in December 2015 at his OV with Dr. Marlou Porch, patient however started taking it again shortly after it was discontinued. Patient has been taking the medication since. Patient wants to keep on Isosorbide. Informed patient that we would need an order from Dr. Marlou Porch. Informed him a message would be sent to Dr. Marlou Porch for recommendation. Patient is aware that Dr. Marlou Porch is out of the office until Monday. Patient verbalized understanding and will wait for Dr. Marlou Porch response.

## 2015-03-01 NOTE — Telephone Encounter (Signed)
New Message:  Pt is calling in about his Isosorbide medication. He states that he has been having it refilled since 04/2014 even though his records reveals a discontinuation. He says that last refill was on 12/29/2014. He would like to have this renewed for his angina pains. Please f/u with him  Thanks

## 2015-03-06 MED ORDER — ISOSORBIDE MONONITRATE ER 30 MG PO TB24: 30.0000 mg | ORAL_TABLET | Freq: Every day | ORAL | Status: DC

## 2015-03-06 NOTE — Addendum Note (Signed)
Addended by: Shellia Cleverly on: 03/06/2015 05:47 PM   Modules accepted: Orders

## 2015-03-06 NOTE — Telephone Encounter (Signed)
Pt aware RX sent into [facility]

## 2015-03-06 NOTE — Telephone Encounter (Signed)
I'm fine with him continuing his isosorbide. Candee Furbish, MD

## 2015-03-21 DIAGNOSIS — H43811 Vitreous degeneration, right eye: Secondary | ICD-10-CM | POA: Diagnosis not present

## 2015-03-21 DIAGNOSIS — H33022 Retinal detachment with multiple breaks, left eye: Secondary | ICD-10-CM | POA: Diagnosis not present

## 2015-03-21 DIAGNOSIS — H35372 Puckering of macula, left eye: Secondary | ICD-10-CM | POA: Diagnosis not present

## 2015-04-21 ENCOUNTER — Ambulatory Visit (INDEPENDENT_AMBULATORY_CARE_PROVIDER_SITE_OTHER): Payer: Medicare Other | Admitting: Cardiology

## 2015-04-21 ENCOUNTER — Encounter: Payer: Self-pay | Admitting: Cardiology

## 2015-04-21 VITALS — BP 128/82 | HR 56 | Ht 70.0 in | Wt 206.6 lb

## 2015-04-21 DIAGNOSIS — R002 Palpitations: Secondary | ICD-10-CM

## 2015-04-21 DIAGNOSIS — E7800 Pure hypercholesterolemia, unspecified: Secondary | ICD-10-CM | POA: Diagnosis not present

## 2015-04-21 DIAGNOSIS — I2583 Coronary atherosclerosis due to lipid rich plaque: Principal | ICD-10-CM

## 2015-04-21 DIAGNOSIS — R42 Dizziness and giddiness: Secondary | ICD-10-CM

## 2015-04-21 DIAGNOSIS — I208 Other forms of angina pectoris: Secondary | ICD-10-CM

## 2015-04-21 DIAGNOSIS — I2510 Atherosclerotic heart disease of native coronary artery without angina pectoris: Secondary | ICD-10-CM

## 2015-04-21 DIAGNOSIS — Z79899 Other long term (current) drug therapy: Secondary | ICD-10-CM

## 2015-04-21 LAB — LIPID PANEL
Cholesterol: 118 mg/dL — ABNORMAL LOW (ref 125–200)
HDL: 40 mg/dL (ref >=40)
LDL CALC: 60 mg/dL (ref <130)
TRIGLYCERIDES: 90 mg/dL (ref <150)
Total CHOL/HDL Ratio: 3 Ratio (ref <=5.0)
VLDL: 18 mg/dL (ref <30)

## 2015-04-21 LAB — ALT: ALT: 23 U/L (ref 9–46)

## 2015-04-21 NOTE — Progress Notes (Signed)
[facility], [address] [place], [place]  [address] Phone: [phone] Fax:  [phone]  Date:  04/21/2015   ID:  BRAEDYN PETREA, DOB 1940-10-23, MRN [id number]  PCP:  Donnie Coffin, MD   History of Present Illness: Randy [name] is a 74 y.o. male with coronary artery disease, bare-metal stent to LAD in 2000, cath in 2003 showed patent stent with hyperlipidemia. Former patient of Dr. Leonia Reeves. Last stress test in system 2007 normal EF, no ischemia. Retired doing well. Dizziness blurry vision, MRI normal, improved with vitamin D.   CAD-prior symptoms were dyspnea on exertion. He is not experiencing any of these symptoms. He is doing well. Retired. No NTG. Has occasional palpitations at rest rare. No syncope. Quick burst. Working on rental houses, does his own floors.    Hyperlipidemia-excellent lipid control previously on Lipitor and Zetia. We will continue with current dosing    Wt Readings from Last 3 Encounters:  04/21/15 206 lb 9.6 oz (93.713 kg)  04/19/14 201 lb (91.173 kg)  04/19/13 202 lb (91.627 kg)     Past Medical History  Diagnosis Date  . Hypercholesterolemia   . CAD (coronary artery disease)   . Rosacea   . Arthritis   . Melanoma [facility])     Past Surgical History  Procedure Laterality Date  . Tonsillectomy    . Coronary angioplasty with stent placement    . Left temporal melanoma    . Colonoscopy      Current Outpatient Prescriptions  Medication Sig Dispense Refill  . aspirin 81 MG tablet Take 81 mg by mouth 2 (two) times daily.    Marland Kitchen atorvastatin (LIPITOR) 20 MG tablet Take 1 tablet (20 mg total) by mouth daily. 30 tablet 11  . clindamycin (CLINDAGEL) 1 % gel Apply 1 application topically 2 (two) times daily.    . isosorbide mononitrate (IMDUR) 30 MG 24 hr tablet Take 1 tablet (30 mg total) by mouth daily. 30 tablet 3  . meloxicam (MOBIC) 15 MG tablet Take 15 mg by mouth as needed for pain.    . Multiple Vitamins-Minerals (CENTRUM CARDIO) TABS Take  2 tablets by mouth daily.     . nitroGLYCERIN (NITROSTAT) 0.4 MG SL tablet Place 1 tablet (0.4 mg total) under the tongue every 5 (five) minutes as needed for chest pain. 25 tablet 3  . ZETIA 10 MG tablet TAKE 1 TABLET BY MOUTH EVERY DAY 30 tablet 6  . Vitamin D, Ergocalciferol, (DRISDOL) 50000 UNITS CAPS capsule Take 50,000 Units by mouth once a week.  12   No current facility-administered medications for this visit.    Allergies:   No Known Allergies  Social History:  The patient  reports that he has quit smoking. He does not have any smokeless tobacco history on file. He reports that he does not drink alcohol or use illicit drugs.   ROS:  Please see the history of present illness.   Denies any fevers, syncope, orthopnea, PND  PHYSICAL EXAM: VS:  BP 128/82 mmHg  Pulse 56  Ht 5\' 10"  (1.778 m)  Wt 206 lb 9.6 oz (93.713 kg)  BMI 29.64 kg/m2 Well nourished, well developed, in no acute distress HEENT: normal Neck: no JVD Cardiac:  normal S1, S2; RRR; no murmur Lungs:  clear to auscultation bilaterally, no wheezing, rhonchi or rales Abd: soft, nontender, no hepatomegaly Ext: no edema Skin: warm and dry Neuro: no focal abnormalities noted  EKG:   Today 04/21/15-sinus  bradycardia with first-degree AV block heart rate 54, PR interval 222 ms. 04/19/14-sinus rhythm, 64, left axis deviation, no other abnormalities Sinus bradycardia rate 55, he borderline LVH, borderline intraventricular conduction delay     Labs: 07/21/13-creatinine 1.0, potassium 4.5  MRI: 07/24/13-mild small vessel disease brain.  Carotid Doppler: 01/02/15: Less than 50% stenosis in the bilateral internal carotid arteries. There is calcified plaque in both internal carotid bulbs as described above.  ASSESSMENT AND PLAN:  1. Coronary artery disease-currently doing well without any symptoms. No anginal symptoms. Doing very well. Very active. Coronary anatomy reviewed as above.  2. Hyperlipidemia-under excellent control.  Continue with current medication. Last lab work -LDL 69. Continuing combination therapy. Checking lipid profile, ALT. 3. Dizziness-improved after level of vitamin D regulated. 4. Angina-under good control. No dyspnea on exertion which was his anginal equivalent.  5. 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 Continue with secondary prevention. 6. One-year follow-up.  Signed, Candee Furbish, MD [facility]  04/21/2015 8:09 AM

## 2015-04-21 NOTE — Patient Instructions (Signed)
Medication Instructions:  The current medical regimen is effective;  continue present plan and medications.  Labwork: Please have blood work today (Lipid and ALT)  Follow-Up: Follow up in 1 year with Dr. Marlou Porch.  You will receive a letter in the mail 2 months before you are due.  Please call us when you receive this letter to schedule your follow up appointment.   If you need a refill on your cardiac medications before your next appointment, please call your pharmacy.  Thank you for choosing [facility]!!

## 2015-04-24 ENCOUNTER — Telehealth: Payer: Self-pay | Admitting: Cardiology

## 2015-04-24 NOTE — Telephone Encounter (Signed)
The pt is advised of his lab results and he verbalized understanding. 

## 2015-04-24 NOTE — Telephone Encounter (Signed)
Follow Up   Pt returned call to discuss Cholesterol test results.

## 2015-04-25 NOTE — Addendum Note (Signed)
Addended by: Freada Bergeron on: 04/25/2015 05:38 PM   Modules accepted: Orders

## 2015-06-30 DIAGNOSIS — E559 Vitamin D deficiency, unspecified: Secondary | ICD-10-CM | POA: Diagnosis not present

## 2015-07-13 DIAGNOSIS — L821 Other seborrheic keratosis: Secondary | ICD-10-CM | POA: Diagnosis not present

## 2015-07-13 DIAGNOSIS — L570 Actinic keratosis: Secondary | ICD-10-CM | POA: Diagnosis not present

## 2015-07-13 DIAGNOSIS — D1801 Hemangioma of skin and subcutaneous tissue: Secondary | ICD-10-CM | POA: Diagnosis not present

## 2015-07-16 ENCOUNTER — Other Ambulatory Visit: Payer: Self-pay | Admitting: Cardiology

## 2015-07-17 ENCOUNTER — Other Ambulatory Visit: Payer: Self-pay | Admitting: *Deleted

## 2015-07-17 MED ORDER — EZETIMIBE 10 MG PO TABS: 10.0000 mg | ORAL_TABLET | Freq: Every day | ORAL | Status: DC

## 2015-07-17 MED ORDER — ISOSORBIDE MONONITRATE ER 30 MG PO TB24: 30.0000 mg | ORAL_TABLET | Freq: Every day | ORAL | Status: DC

## 2015-07-24 ENCOUNTER — Other Ambulatory Visit: Payer: Self-pay | Admitting: Cardiology

## 2015-08-21 ENCOUNTER — Other Ambulatory Visit: Payer: Self-pay | Admitting: Cardiology

## 2015-08-21 MED ORDER — ATORVASTATIN CALCIUM 20 MG PO TABS: 20.0000 mg | ORAL_TABLET | Freq: Every day | ORAL | Status: DC

## 2015-09-19 ENCOUNTER — Other Ambulatory Visit: Payer: Self-pay | Admitting: Cardiology

## 2015-09-19 DIAGNOSIS — H2511 Age-related nuclear cataract, right eye: Secondary | ICD-10-CM | POA: Diagnosis not present

## 2015-09-19 DIAGNOSIS — H52203 Unspecified astigmatism, bilateral: Secondary | ICD-10-CM | POA: Diagnosis not present

## 2015-09-19 DIAGNOSIS — H524 Presbyopia: Secondary | ICD-10-CM | POA: Diagnosis not present

## 2015-11-30 DIAGNOSIS — H538 Other visual disturbances: Secondary | ICD-10-CM | POA: Diagnosis not present

## 2015-11-30 DIAGNOSIS — E559 Vitamin D deficiency, unspecified: Secondary | ICD-10-CM | POA: Diagnosis not present

## 2015-12-01 ENCOUNTER — Encounter: Payer: Self-pay | Admitting: Neurology

## 2015-12-01 ENCOUNTER — Other Ambulatory Visit: Payer: Self-pay | Admitting: *Deleted

## 2015-12-01 ENCOUNTER — Ambulatory Visit (INDEPENDENT_AMBULATORY_CARE_PROVIDER_SITE_OTHER): Payer: Medicare Other | Admitting: Neurology

## 2015-12-01 ENCOUNTER — Other Ambulatory Visit: Payer: Medicare Other

## 2015-12-01 VITALS — BP 120/78 | HR 65 | Ht 70.0 in | Wt 205.2 lb

## 2015-12-01 DIAGNOSIS — H539 Unspecified visual disturbance: Secondary | ICD-10-CM

## 2015-12-01 DIAGNOSIS — R6889 Other general symptoms and signs: Secondary | ICD-10-CM | POA: Diagnosis not present

## 2015-12-01 DIAGNOSIS — G609 Hereditary and idiopathic neuropathy, unspecified: Secondary | ICD-10-CM

## 2015-12-01 DIAGNOSIS — R42 Dizziness and giddiness: Secondary | ICD-10-CM

## 2015-12-01 DIAGNOSIS — IMO0001 Reserved for inherently not codable concepts without codable children: Secondary | ICD-10-CM

## 2015-12-01 NOTE — Progress Notes (Signed)
Ordered

## 2015-12-01 NOTE — Patient Instructions (Addendum)
1.  Routine EEG 2.  Check blood work 3.  Discussed options of possible migraine variant which can manifest with various neurological symptoms even in the absence of headaches, but this is a diagnosis of exclusion.  Trial of preventative migraine medication was offered, but since symptoms are infrequent, decided to hold on starting a daily medication for now  Return to clinic 4 months

## 2015-12-01 NOTE — Progress Notes (Signed)
[facility] Clinic Note - Initial Visit   Date: 12/01/2015  SHY BRUSH MRN: [id number] DOB: 1940/08/06   Dear Dr. Alroy Dust:  Thank you for your kind referral of Randy [name] for consultation of blurred vision. Although his history is well known to you, please allow [name] to reiterate it for the purpose of our medical record. The patient was accompanied to the clinic by self.    History of Present Illness: Randy [name] is a 75 y.o. right-handed Caucasian male with hyperlipidemia and CAD s/p PCI with stent placement (2001), melanoma of the scalp s/p resection (2009), and angina presenting for evaluation of spells of blurry vision.    Starting in May 2014, he began experiencing 1-3 minute spells of blurred vision and lightheadedness, occuring 2-3 times per month every few months.  He saw opthalmology and underwent cataract surgery on the left but he continued to have these symptoms.  Later, he was found to have a retinal detachment on the left which was repaired.  Despite having his eye issues addressed, he continued to have blurry vision.  He saw cardiology who performed US carotids which showed mild disease. MRI brain in 2015 did not show any structural abnormalities to explain symptoms.  His blurry vision is mostly involves his left eye and sometimes he finds himself covering his left eye which helps.  He reports having vertical double vision with his last episode, but that was the only time he recalls seeing two images.  The double vision did not resolve with closing one eye.  He usually has associated lightheadedness.  These spells are self-resolving within a few minutes.  He does not have loss of consciousness.  He does not have any associated headaches.  He can tell when it is going to come on because his body feels anxious prior to it.  Following this, he has noticed new weakness in the legs which started a few months ago.  He has to sit down and rest during  these events.   He also reports having neuropathy of the feet which started around the same time.  He has numbness and tingling of the toes. No weakness.  He walks independently and has not sustained any falls.   Out-side paper records, electronic medical record, and images have been reviewed where available and summarized as:  MRI brain wo contrast 07/24/2013:  1. No evidence of acute intracranial abnormality or mass. 2. Mild chronic small vessel ischemic disease.  US carotids 01/02/2015:  Less than 50% stenosis in the bilateral internal carotid arteries. There is calcified plaque in both internal carotid bulbs as described above.  Labs 12/29/2014:  vitamin B12 966, TSH 2.10, vitamin D 22.3*  Past Medical History  Diagnosis Date  . Hypercholesterolemia   . CAD (coronary artery disease)   . Rosacea   . Arthritis   . Melanoma ([place]) 2009    Past Surgical History  Procedure Laterality Date  . Tonsillectomy    . Coronary angioplasty with stent placement    . Left temporal melanoma    . Colonoscopy       Medications:  Outpatient Encounter Prescriptions as of 12/01/2015  Medication Sig Note  . aspirin 81 MG tablet Take 81 mg by mouth 2 (two) times daily.   Marland Kitchen atorvastatin (LIPITOR) 20 MG tablet Take 1 tablet (20 mg total) by mouth daily.   . clindamycin (CLINDAGEL) 1 % gel Apply 1 application topically 2 (two) times daily.   Marland Kitchen ezetimibe (ZETIA)  10 MG tablet Take 1 tablet (10 mg total) by mouth daily.   . isosorbide mononitrate (IMDUR) 30 MG 24 hr tablet Take 1 tablet (30 mg total) by mouth daily.   . meloxicam (MOBIC) 15 MG tablet Take 15 mg by mouth as needed for pain.   . Multiple Vitamins-Minerals (CENTRUM CARDIO) TABS Take 2 tablets by mouth daily.    Marland Kitchen NITROSTAT 0.4 MG SL tablet PLACE 1 TABLET (0.4 MG TOTAL) UNDER THE TONGUE EVERY 5 (FIVE) MINUTES AS NEEDED FOR CHEST PAIN.   Marland Kitchen Vitamin D, Ergocalciferol, (DRISDOL) 50000 UNITS CAPS capsule Take 50,000 Units by mouth once a week.  04/21/2015: Received from: External Pharmacy   No facility-administered encounter medications on file as of 12/01/2015.     Allergies: No Known Allergies  Family History: Family History  Problem Relation Age of Onset  . Stomach cancer Mother   . Heart attack Father     Deceased, 42  . Diabetes Sister   . Heart attack Sister   . Multiple sclerosis Son   . Healthy Daughter     Social History: Social History  Substance Use Topics  . Smoking status: Former Research scientist (life sciences)  . Smokeless tobacco: Never Used     Comment: patient had quit for about 8yrs ago  . Alcohol Use: No   Social History   Social History Narrative   Lives with wife in a one story home.  Has 2 children.     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    Education: high school    Review of Systems:  CONSTITUTIONAL: No fevers, chills, night sweats, or weight loss.   EYES: + visual changes or eye pain ENT: No hearing changes.  No history of nose bleeds.   RESPIRATORY: No cough, wheezing and shortness of breath.   CARDIOVASCULAR: Negative for chest pain, and palpitations.   GI: Negative for abdominal discomfort, blood in stools or black stools.  No recent change in bowel habits.   GU:  No history of incontinence.   MUSCLOSKELETAL: No history of joint pain or swelling.  No myalgias.   SKIN: Negative for lesions, rash, and itching.   HEMATOLOGY/ONCOLOGY: Negative for prolonged bleeding, bruising easily, and swollen nodes.  No history of cancer.   ENDOCRINE: Negative for cold or heat intolerance, polydipsia or goiter.   PSYCH:  No depression or anxiety symptoms.   NEURO: As Above.   Vital Signs:  BP 120/78 mmHg  Pulse 65  Ht 5\' 10"  (1.778 m)  Wt 205 lb 3 oz (93.072 kg)  BMI 29.44 kg/m2  SpO2 97%   General Medical Exam:   General:  Well appearing, comfortable.   Eyes/ENT: see cranial nerve examination.   Neck: No masses appreciated.  Full range of motion  without tenderness.  No carotid bruits. Respiratory:  Clear to auscultation, good air entry bilaterally.   Cardiac:  Regular rate and rhythm, no murmur.   Extremities:  No deformities, edema, or skin discoloration.  Skin:  No rashes or lesions.  Neurological Exam: MENTAL STATUS including orientation to time, place, person, recent and remote memory, attention span and concentration, language, and fund of knowledge is normal.  Speech is not dysarthric.  CRANIAL NERVES: II:  No visual field defects.  Unremarkable fundi.   III-IV-VI: Pupils equal round and reactive to light.  Normal conjugate, extra-ocular eye movements in all directions of gaze.  No nystagmus.  No ptosis.   V:  Normal facial sensation.  VII:  Normal facial symmetry and movements.  No pathologic facial reflexes.  VIII:  Normal hearing and vestibular function.   IX-X:  Normal palatal movement.   XI:  Normal shoulder shrug and head rotation.   XII:  Normal tongue strength and range of motion, no deviation or fasciculation.  MOTOR:  No atrophy, fasciculations or abnormal movements.  No pronator drift.  Tone is normal.    Right Upper Extremity:    Left Upper Extremity:    Deltoid  5/5   Deltoid  5/5   Biceps  5/5   Biceps  5/5   Triceps  5/5   Triceps  5/5   Wrist extensors  5/5   Wrist extensors  5/5   Wrist flexors  5/5   Wrist flexors  5/5   Finger extensors  5/5   Finger extensors  5/5   Finger flexors  5/5   Finger flexors  5/5   Dorsal interossei  5/5   Dorsal interossei  5/5   Abductor pollicis  5/5   Abductor pollicis  5/5   Tone (Ashworth scale)  0  Tone (Ashworth scale)  0   Right Lower Extremity:    Left Lower Extremity:    Hip flexors  5/5   Hip flexors  5/5   Hip extensors  5/5   Hip extensors  5/5   Knee flexors  5/5   Knee flexors  5/5   Knee extensors  5/5   Knee extensors  5/5   Dorsiflexors  5/5   Dorsiflexors  5/5   Plantarflexors  5/5   Plantarflexors  5/5   Toe extensors  5/5   Toe extensors   5/5   Toe flexors  5/5   Toe flexors  5/5   Tone (Ashworth scale)  0  Tone (Ashworth scale)  0   MSRs:  Right                                                                 Left brachioradialis 2+  brachioradialis 2+  biceps 2+  biceps 2+  triceps 2+  triceps 2+  patellar 2+  patellar 2+  ankle jerk 0  ankle jerk 0  Hoffman no  Hoffman no  plantar response down  plantar response down   SENSORY:  Vibration absent at the ankles, temperature and pin prick reduced distal to mid-calf bilaterally. Romberg's sign absent.   COORDINATION/GAIT: Normal finger-to- nose-finger.  Intact rapid alternating movements bilaterally.  Gait narrow based and stable. Tandem and stressed gait intact.    IMPRESSION: Mr. Hergenrader is a 75 year-old gentleman referred for evaluation of spells of blurry vision and lightheadedness.  He has had extensive opthalmology evaluation, MRI brain, and US carotids which have not provided a diagnosis.  Orthostatic vital signs are negative.  With a relatively normal exam, the likelihood of a worrisome neurological condition is low.  I do not think he has myasthenia because he endorses blurry vision and less so, diplopia, but since this is a treatable condition, AChR antibodies will be checked.    With his stereotyped spells simple partial seizure and migraine variant can be considered, although my suspicion for both is low.  To be complete, he will have routine EEG.  Discussed options of possible  migraine variant which can manifest with various neurological symptoms even in the absence of headaches, but this is a diagnosis of exclusion.  Trial of preventative migraine medication was offered, but since symptoms are infrequent, decided to hold on starting a daily medication for now.  To be complete, I will exclude vessel disease such as aneurysm with CTA head.   Return to clinic in 4 months.   The duration of this appointment visit was 50 minutes of face-to-face time with the patient.   Greater than 50% of this time was spent in counseling, explanation of diagnosis, planning of further management, and coordination of care.   Thank you for allowing me to participate in patient's care.  If I can answer any additional questions, I would be pleased to do so.    Sincerely,    Marybella Ethier K. Posey Pronto, DO

## 2015-12-01 NOTE — Progress Notes (Signed)
Left message informing patient.

## 2015-12-05 LAB — PROTEIN ELECTROPHORESIS, SERUM
ALPHA-2-GLOBULIN: 0.8 g/dL (ref 0.5–0.9)
Albumin ELP: 4 g/dL (ref 3.8–4.8)
Alpha-1-Globulin: 0.3 g/dL (ref 0.2–0.3)
BETA 2: 0.4 g/dL (ref 0.2–0.5)
BETA GLOBULIN: 0.4 g/dL (ref 0.4–0.6)
GAMMA GLOBULIN: 1.1 g/dL (ref 0.8–1.7)
Total Protein, Serum Electrophoresis: 6.9 g/dL (ref 6.1–8.1)

## 2015-12-05 LAB — IMMUNOFIXATION ELECTROPHORESIS
IgA: 154 mg/dL (ref 81–463)
IgG (Immunoglobin G), Serum: 1168 mg/dL (ref 694–1618)
IgM, Serum: 88 mg/dL (ref 48–271)

## 2015-12-06 LAB — COPPER, SERUM: COPPER: 110 ug/dL (ref 72–166)

## 2015-12-07 ENCOUNTER — Ambulatory Visit (INDEPENDENT_AMBULATORY_CARE_PROVIDER_SITE_OTHER): Payer: Medicare Other | Admitting: Neurology

## 2015-12-07 DIAGNOSIS — IMO0001 Reserved for inherently not codable concepts without codable children: Secondary | ICD-10-CM

## 2015-12-07 DIAGNOSIS — G609 Hereditary and idiopathic neuropathy, unspecified: Secondary | ICD-10-CM | POA: Diagnosis not present

## 2015-12-07 DIAGNOSIS — R6889 Other general symptoms and signs: Secondary | ICD-10-CM

## 2015-12-07 DIAGNOSIS — H539 Unspecified visual disturbance: Secondary | ICD-10-CM

## 2015-12-07 LAB — MYASTHENIA GRAVIS PANEL 2: ACETYLCHOLINE REC MOD AB: 15 %{inhibition}

## 2015-12-08 ENCOUNTER — Ambulatory Visit
Admission: RE | Admit: 2015-12-08 | Discharge: 2015-12-08 | Disposition: A | Discharge status: Home / Self Care | Payer: Medicare Other | Source: Ambulatory Visit | Attending: Neurology | Admitting: Neurology

## 2015-12-08 DIAGNOSIS — G609 Hereditary and idiopathic neuropathy, unspecified: Secondary | ICD-10-CM

## 2015-12-08 DIAGNOSIS — IMO0001 Reserved for inherently not codable concepts without codable children: Secondary | ICD-10-CM

## 2015-12-08 DIAGNOSIS — I672 Cerebral atherosclerosis: Secondary | ICD-10-CM | POA: Diagnosis not present

## 2015-12-08 DIAGNOSIS — R6889 Other general symptoms and signs: Secondary | ICD-10-CM

## 2015-12-08 DIAGNOSIS — H539 Unspecified visual disturbance: Secondary | ICD-10-CM

## 2015-12-08 DIAGNOSIS — R42 Dizziness and giddiness: Secondary | ICD-10-CM

## 2015-12-08 MED ORDER — IOPAMIDOL (ISOVUE-370) INJECTION 76%
80.0000 mL | Freq: Once | INTRAVENOUS | Status: AC | PRN
  Administered 2015-12-08: 80 mL via INTRAVENOUS

## 2015-12-08 MED ORDER — IOPAMIDOL (ISOVUE-370) INJECTION 76%: 80.0000 mL | Freq: Once | INTRAVENOUS | Status: DC | PRN

## 2015-12-08 NOTE — Procedures (Signed)
ELECTROENCEPHALOGRAM REPORT  Date of Study: 12/07/2015  Patient's Name: Randy [name] MRN: [id number] Date of Birth: 07/01/1940  Referring Provider: Dr. Narda Amber  Clinical History: This is a 75 year old man with spells of blurry vision in the left eye and lightheadedness lasting a few minutes.  Medications: ASA, Lipitor, Zetia, Clindagel, Imdur, Mobic, Centrum Cardio, NTG  Technical Summary: A multichannel digital EEG recording measured by the international 10-20 system with electrodes applied with paste and impedances below 5000 ohms performed in our laboratory with EKG monitoring in a predominantly drowsy and asleep patient.  Hyperventilation was not performed. Photic stimulation was performed.  The digital EEG was referentially recorded, reformatted, and digitally filtered in a variety of bipolar and referential montages for optimal display.    Description: The patient is predominantly drowsy and asleep during the recording.  During brief period of wakefulness, there is a symmetric low to medium voltage 10 Hz posterior dominant rhythm that attenuates poorly attenuates with eye opening and eye closure.  The record is symmetric.  During drowsiness and stage I sleep, there is central beta activity and an increase in theta slowing of the background, with shifting asymmetry over the bilateral temporal regions, at times sharply contoured over the right temporal region without clear epileptogenic potential. Deeper stages of sleep were not seen. Photic stimulation did not elicit any abnormalities.  There were no epileptiform discharges or electrographic seizures seen.    EKG lead was unremarkable.  Impression: This predominantly drowsy and asleep EEG is within normal limits.   Clinical Correlation: A normal EEG does not exclude a clinical diagnosis of epilepsy.  If further clinical questions remain, prolonged EEG may be helpful.  Clinical correlation is advised.   Ellouise Newer, M.D.

## 2015-12-11 ENCOUNTER — Telehealth: Payer: Self-pay | Admitting: Neurology

## 2015-12-11 NOTE — Telephone Encounter (Signed)
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  Patient denies any facial weakness and has bilateral hearing aids for 10+ years, this is is unlikely clinically relevant.    Overall, he reports that his blurred vision is better.  No new complaints.  He will call if there is any worsening visual symptoms.  Consider neuro-opthalmology referral going forward.  Donika K. Posey Pronto, DO

## 2016-01-03 ENCOUNTER — Other Ambulatory Visit: Payer: Self-pay | Admitting: *Deleted

## 2016-01-03 MED ORDER — ISOSORBIDE MONONITRATE ER 30 MG PO TB24: 30.0000 mg | ORAL_TABLET | Freq: Every day | ORAL | 1 refills | Status: DC

## 2016-01-03 MED ORDER — EZETIMIBE 10 MG PO TABS: 10.0000 mg | ORAL_TABLET | Freq: Every day | ORAL | 1 refills | Status: DC

## 2016-01-03 MED ORDER — ATORVASTATIN CALCIUM 20 MG PO TABS: 20.0000 mg | ORAL_TABLET | Freq: Every day | ORAL | 1 refills | Status: DC

## 2016-01-09 DIAGNOSIS — L821 Other seborrheic keratosis: Secondary | ICD-10-CM | POA: Diagnosis not present

## 2016-01-09 DIAGNOSIS — Z85820 Personal history of malignant melanoma of skin: Secondary | ICD-10-CM | POA: Diagnosis not present

## 2016-01-09 DIAGNOSIS — D1801 Hemangioma of skin and subcutaneous tissue: Secondary | ICD-10-CM | POA: Diagnosis not present

## 2016-01-09 DIAGNOSIS — L738 Other specified follicular disorders: Secondary | ICD-10-CM | POA: Diagnosis not present

## 2016-01-09 DIAGNOSIS — L570 Actinic keratosis: Secondary | ICD-10-CM | POA: Diagnosis not present

## 2016-01-09 DIAGNOSIS — D692 Other nonthrombocytopenic purpura: Secondary | ICD-10-CM | POA: Diagnosis not present

## 2016-01-10 DIAGNOSIS — N401 Enlarged prostate with lower urinary tract symptoms: Secondary | ICD-10-CM | POA: Diagnosis not present

## 2016-01-17 DIAGNOSIS — R351 Nocturia: Secondary | ICD-10-CM | POA: Diagnosis not present

## 2016-01-17 DIAGNOSIS — N401 Enlarged prostate with lower urinary tract symptoms: Secondary | ICD-10-CM | POA: Diagnosis not present

## 2016-01-17 DIAGNOSIS — Z87442 Personal history of urinary calculi: Secondary | ICD-10-CM | POA: Diagnosis not present

## 2016-01-17 DIAGNOSIS — N5201 Erectile dysfunction due to arterial insufficiency: Secondary | ICD-10-CM | POA: Diagnosis not present

## 2016-01-17 DIAGNOSIS — N486 Induration penis plastica: Secondary | ICD-10-CM | POA: Diagnosis not present

## 2016-01-30 DIAGNOSIS — Z23 Encounter for immunization: Secondary | ICD-10-CM | POA: Diagnosis not present

## 2016-04-03 ENCOUNTER — Encounter: Payer: Self-pay | Admitting: Neurology

## 2016-04-03 ENCOUNTER — Ambulatory Visit (INDEPENDENT_AMBULATORY_CARE_PROVIDER_SITE_OTHER): Payer: Medicare Other | Admitting: Neurology

## 2016-04-03 VITALS — BP 110/74 | HR 65 | Ht 70.0 in | Wt 198.0 lb

## 2016-04-03 DIAGNOSIS — H539 Unspecified visual disturbance: Secondary | ICD-10-CM | POA: Diagnosis not present

## 2016-04-03 NOTE — Patient Instructions (Addendum)
Referral for second opinion with Neurophthalmology - Dr. Ginger Organ at [facility]

## 2016-04-03 NOTE — Progress Notes (Signed)
Follow-up Visit   Date: 04/03/16    SHEHZAD TELL MRN: [id number] DOB: [birth year]   Interim History: Randy [name] is a 75 y.o. right-handed Caucasian male with and CAD s/p PCI with stent placement (2001), melanoma of the scalp s/p resection (2009), and angina returning to the clinic for follow-up of vision changes.  The patient was accompanied to the clinic by self.  History of present illness: Starting in May 2014, he began experiencing 1-3 minute spells of blurred vision and lightheadedness, occuring 2-3 times per month every few months.  He saw opthalmology and underwent cataract surgery on the left but he continued to have these symptoms.  Later, he was found to have a retinal detachment on the left which was repaired.  Despite having his eye issues addressed, he continued to have blurry vision.  He saw cardiology who performed US carotids which showed mild disease. MRI brain in 2015 did not show any structural abnormalities to explain symptoms.  His blurry vision is mostly involves his left eye and sometimes he finds himself covering his left eye which helps.  He reports having vertical double vision with his last episode, but that was the only time he recalls seeing two images.  The double vision did not resolve with closing one eye.  He usually has associated lightheadedness.  These spells are self-resolving within a few minutes.  He does not have loss of consciousness.  He does not have any associated headaches.  He can tell when it is going to come on because his body feels anxious prior to it.   He also reports having neuropathy of the feet which started around the same time.  He has numbness and tingling of the toes. No weakness. He walks independently and has not sustained any falls.   UPDATE 04/03/2016:  He is here for 4 month follow-up.  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  Patient denies any facial weakness and has bilateral hearing aids for 10+ years, this is is unlikely clinically relevant.  He also has EEG for these symptoms which was negative.   He brings a log of is symptoms since July 2017.  He has 6 additional spells of blurry vision lasting ~20 seconds, usually in the left eye.  One one occasion, he developed room-spinning sensation, which was new for him.  This lasting 2 hours minutes and self resolved.   No new diplopia, facial paresthesias, dysphagia, or limb weakness.   Medications:  Current Outpatient Prescriptions on File Prior to Visit  Medication Sig Dispense Refill  . aspirin 81 MG tablet Take 81 mg by mouth 2 (two) times daily.    Marland Kitchen atorvastatin (LIPITOR) 20 MG tablet Take 1 tablet (20 mg total) by mouth daily. 90 tablet 1  . clindamycin (CLINDAGEL) 1 % gel Apply 1 application topically 2 (two) times daily.    Marland Kitchen ezetimibe (ZETIA) 10 MG tablet Take 1 tablet (10 mg total) by mouth daily. 90 tablet 1  . isosorbide mononitrate (IMDUR) 30 MG 24 hr tablet Take 1 tablet (30 mg total) by mouth daily. 90 tablet 1  . meloxicam (MOBIC) 15 MG tablet Take 15 mg by mouth as needed for pain.    . Multiple Vitamins-Minerals (CENTRUM CARDIO) TABS  Take 2 tablets by mouth daily.     Marland Kitchen NITROSTAT 0.4 MG SL tablet PLACE 1 TABLET (0.4 MG TOTAL) UNDER THE TONGUE EVERY 5 (FIVE) MINUTES AS NEEDED FOR CHEST PAIN. 25 tablet 5  . Vitamin D, Ergocalciferol, (DRISDOL) 50000 UNITS CAPS capsule Take 50,000 Units by mouth once a week.  12   No current facility-administered medications on file prior to visit.     Allergies: No Known Allergies  Review of Systems:  CONSTITUTIONAL: No fevers, chills, night sweats, or weight loss.  EYES: No visual changes or eye pain ENT: No hearing changes.  No history of  nose bleeds.   RESPIRATORY: No cough, wheezing and shortness of breath.   CARDIOVASCULAR: Negative for chest pain, and palpitations.   GI: Negative for abdominal discomfort, blood in stools or black stools.  No recent change in bowel habits.   GU:  No history of incontinence.   MUSCLOSKELETAL: No history of joint pain or swelling.  No myalgias.   SKIN: Negative for lesions, rash, and itching.   ENDOCRINE: Negative for cold or heat intolerance, polydipsia or goiter.   PSYCH:  No depression or anxiety symptoms.   NEURO: As Above.   Vital Signs:  BP 110/74   Pulse 65   Ht 5\' 10"  (1.778 m)   Wt 198 lb (89.8 kg)   SpO2 96%   BMI 28.41 kg/m   Neurological Exam: MENTAL STATUS including orientation to time, place, person, recent and remote memory, attention span and concentration, language, and fund of knowledge is normal.  Speech is not dysarthric.  CRANIAL NERVES: Pupils equal round and reactive to light.  Normal conjugate, extra-ocular eye movements in all directions of gaze.  There is fatigable nystagmus with right gaze.  No ptosis.  Face is symmetric. Palate elevates symmetrically.  Tongue is midline.  MOTOR:  Motor strength is 5/5 in all extremities. No pronator drift.  Tone is normal.    MSRs:  Reflexes are 2+/4 throughout, except absent at the Achilles bilaterally.  COORDINATION/GAIT:   Gait narrow based and stable. Stressed and tandem gait intact.   Data: MRI brain wo contrast 07/24/2013:  1. No evidence of acute intracranial abnormality or mass. 2. Mild chronic small vessel ischemic disease.  US carotids 01/02/2015:  Less than 50% stenosis in the bilateral internal carotid arteries. There is calcified plaque in both internal carotid bulbs as described above.  Labs 12/29/2014:  vitamin B12 966, TSH 2.10, vitamin D 22.3* Labs 12/01/2015:  SPEP with IFE no M protein, AChR antibodies negative, copper 110  Routine EEG 12/07/2015:  Normal  CTA head 12/08/2015: 1. Intracranial  calcified atherosclerosis without hemodynamically significant stenosis. 2. 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 Note that vertebrobasilar tortuosity results in chronic mass effect at the left cerebellopontine angle at the left seventh and eighth cranial nerve root entry zones. 3. Otherwise negative intracranial CTA. 4. Negative for age CT appearance of the brain.  IMPRESSION/PLAN: Mr. Gabourel is a 75 year-old gentleman returning for evaluation of spells of blurry vision and lightheadedness.  He has had extensive opthalmology evaluation, MRI brain, US carotids, CTA head, and EEG which have not provided a diagnosis.  Orthostatic vital signs are negative.  His AChR antibodies were negative. 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  Patient denies any facial weakness and has bilateral hearing aids for 10+ years, this is is unlikely clinically relevant.    With a relatively normal exam, the likelihood of a worrisome neurological condition is low.   Discussed options of possible migraine variant which can manifest with various neurological symptoms even in the absence of headaches, but this is a diagnosis of exclusion.  Trial of preventative migraine medication was offered, but since symptoms are infrequent, decided to hold on starting a daily medication.  At this juncture, I offered to repeat MRI brain wwo contrast or seek a second opinion with neuro-opthalmology at an academic center.  He would like to get a second opinion with neurophthalmology at [facility].     The duration of this appointment visit was 25 minutes of face-to-face time with the patient.  Greater than 50% of this time was spent in  counseling, explanation of diagnosis, planning of further management, and coordination of care.   Thank you for allowing me to participate in patient's care.  If I can answer any additional questions, I would be pleased to do so.    Sincerely,    Donika K. Posey Pronto, DO

## 2016-04-04 NOTE — Progress Notes (Signed)
Appointment is on December 12 at 8:30.  I will notify patient and [facility] will mail him new patient paperwork.

## 2016-04-16 ENCOUNTER — Encounter: Payer: Self-pay | Admitting: Cardiology

## 2016-04-23 ENCOUNTER — Encounter: Payer: Self-pay | Admitting: Cardiology

## 2016-04-23 ENCOUNTER — Ambulatory Visit (INDEPENDENT_AMBULATORY_CARE_PROVIDER_SITE_OTHER): Payer: Medicare Other | Admitting: Cardiology

## 2016-04-23 VITALS — BP 122/68 | HR 50 | Ht 70.0 in | Wt 198.0 lb

## 2016-04-23 DIAGNOSIS — E7800 Pure hypercholesterolemia, unspecified: Secondary | ICD-10-CM

## 2016-04-23 DIAGNOSIS — I2583 Coronary atherosclerosis due to lipid rich plaque: Secondary | ICD-10-CM

## 2016-04-23 DIAGNOSIS — I2510 Atherosclerotic heart disease of native coronary artery without angina pectoris: Secondary | ICD-10-CM | POA: Diagnosis not present

## 2016-04-23 DIAGNOSIS — I208 Other forms of angina pectoris: Secondary | ICD-10-CM | POA: Diagnosis not present

## 2016-04-23 LAB — LIPID PANEL
Cholesterol: 119 mg/dL (ref <200)
HDL: 45 mg/dL (ref >40)
LDL CALC: 54 mg/dL (ref <100)
Total CHOL/HDL Ratio: 2.6 Ratio (ref <5.0)
Triglycerides: 98 mg/dL (ref <150)
VLDL: 20 mg/dL (ref <30)

## 2016-04-23 LAB — ALT: ALT: 30 U/L (ref 9–46)

## 2016-04-23 NOTE — Progress Notes (Signed)
[place]. [address], [address], [place]  [address] Phone: [phone] Fax:  [phone]  Date:  04/23/2016   ID:  Randy [name], DOB 1940-11-02, MRN [id number]  PCP:  Donnie Coffin, MD   History of Present Illness: Randy [name] is a 75 y.o. male with coronary artery disease, bare-metal stent to LAD in 2000, cath in 2003 showed patent stent with hyperlipidemia. Former patient of Dr. Leonia Reeves. Last stress test in system 2007 normal EF, no ischemia. Retired doing well. Periodic blurry vision (going to see [facility]), MRI normal.  CAD-prior symptoms were dyspnea on exertion. He is not experiencing any of these symptoms. He is doing well. Retired. No NTG. Has occasional palpitations at rest rare. No syncope. Quick burst. Working on rental houses, does his own floors.    Hyperlipidemia-excellent lipid control previously on Lipitor and Zetia. We will continue with current dosing. He has decided to change to generic Zetia.  He has lost about 10 pounds. Excellent. Been working hard on rental properties.    Wt Readings from Last 3 Encounters:  04/23/16 198 lb (89.8 kg)  04/03/16 198 lb (89.8 kg)  12/01/15 205 lb 3 oz (93.1 kg)     Past Medical History:  Diagnosis Date  . Arthritis   . CAD (coronary artery disease)   . Hypercholesterolemia   . Melanoma ([place]) 2009  . Rosacea     Past Surgical History:  Procedure Laterality Date  . COLONOSCOPY    . CORONARY ANGIOPLASTY WITH STENT PLACEMENT    . left temporal melanoma    . TONSILLECTOMY      Current Outpatient Prescriptions  Medication Sig Dispense Refill  . aspirin 81 MG tablet Take 81 mg by mouth 2 (two) times daily.    Marland Kitchen atorvastatin (LIPITOR) 20 MG tablet Take 1 tablet (20 mg total) by mouth daily. 90 tablet 1  . clindamycin (CLINDAGEL) 1 % gel Apply 1 application topically 2 (two) times daily.    Marland Kitchen ezetimibe (ZETIA) 10 MG tablet Take 1 tablet (10 mg total) by mouth daily. 90 tablet 1  . isosorbide mononitrate (IMDUR) 30  MG 24 hr tablet Take 1 tablet (30 mg total) by mouth daily. 90 tablet 1  . meloxicam (MOBIC) 15 MG tablet Take 15 mg by mouth as needed for pain.    . Multiple Vitamins-Minerals (CENTRUM CARDIO) TABS Take 2 tablets by mouth daily.     Marland Kitchen NITROSTAT 0.4 MG SL tablet PLACE 1 TABLET (0.4 MG TOTAL) UNDER THE TONGUE EVERY 5 (FIVE) MINUTES AS NEEDED FOR CHEST PAIN. 25 tablet 5  . Vitamin D, Ergocalciferol, (DRISDOL) 50000 UNITS CAPS capsule Take 50,000 Units by mouth once a week.  12   No current facility-administered medications for this visit.     Allergies:   No Known Allergies  Social History:  The patient  reports that he has quit smoking. He has never used smokeless tobacco. He reports that he does not drink alcohol or use drugs.   ROS:  Please see the history of present illness.   Denies any fevers, syncope, orthopnea, PND  PHYSICAL EXAM: VS:  BP 122/68   Pulse (!) 50   Ht 5\' 10"  (1.778 m)   Wt 198 lb (89.8 kg)   BMI 28.41 kg/m  Well nourished, well developed, in no acute distress  HEENT: normal  Neck: no JVD  Cardiac:  normal S1, S2; RRR; no murmur  Lungs:  clear to auscultation bilaterally, no wheezing,  rhonchi or rales  Abd: soft, nontender, no hepatomegaly  Ext: no edema  Skin: warm and dry  Neuro: no focal abnormalities noted  EKG:   Today 04/23/16-sinus bradycardia rate 50 borderline intraventricular conduction the leading PR interval 204 ms. Personally viewed-prior 04/21/15-sinus bradycardia with first-degree AV block heart rate 54, PR interval 222 ms. 04/19/14-sinus rhythm, 64, left axis deviation, no other abnormalities Sinus bradycardia rate 55, he borderline LVH, borderline intraventricular conduction delay     Labs: 07/21/13-creatinine 1.0, potassium 4.5  MRI: 07/24/13-mild small vessel disease brain.  Carotid Doppler: 01/02/15: Less than 50% stenosis in the bilateral internal carotid arteries. There is calcified plaque in both internal carotid bulbs as described  above.  ASSESSMENT AND PLAN:  1. Coronary artery disease-currently doing well without any symptoms. No anginal symptoms. Doing very well. Very active. Coronary anatomy reviewed as above.  2. Transient blurry vision-extensive workup performed here. Neurology decided to send to [facility] for further evaluation. This usually happens once a month. 3. Hyperlipidemia-under excellent control. Continue with current medication. Last lab work -LDL 69. Continuing combination therapy. He is now taking "generic" Zetia. He would like to know what his cholesterol panel looks like now. Checking lipid profile, ALT. 4. Angina-under good control. At one point we decided to try to pull off of the isosorbide but he did have some anginal symptoms. Continue with low-dose. No dyspnea on exertion which was his anginal equivalent.  5. 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 Continue with secondary prevention. 6. One-year follow-up.  Signed, Candee Furbish, MD [facility]  04/23/2016 8:08 AM

## 2016-04-23 NOTE — Patient Instructions (Signed)
Medication Instructions:  Your physician recommends that you continue on your current medications as directed. Please refer to the Current Medication list given to you today.   Labwork: Lipid profile/ALT today  Testing/Procedures: none  Follow-Up: Your physician wants you to follow-up in: 1 year with Dr Marlou Porch. (December 2018).  You will receive a reminder letter in the mail two months in advance. If you don't receive a letter, please call our office to schedule the follow-up appointment.        If you need a refill on your cardiac medications before your next appointment, please call your pharmacy.

## 2016-04-30 DIAGNOSIS — H53123 Transient visual loss, bilateral: Secondary | ICD-10-CM | POA: Diagnosis not present

## 2016-05-02 ENCOUNTER — Telehealth: Payer: Self-pay | Admitting: Cardiology

## 2016-05-02 NOTE — Telephone Encounter (Signed)
Mr. Dimitriou is returning a call.

## 2016-05-02 NOTE — Telephone Encounter (Signed)
Patient calling for lab results. Patient made aware of results. Patient verbalizes understanding.

## 2016-06-13 DIAGNOSIS — E559 Vitamin D deficiency, unspecified: Secondary | ICD-10-CM | POA: Diagnosis not present

## 2016-06-13 DIAGNOSIS — E7800 Pure hypercholesterolemia, unspecified: Secondary | ICD-10-CM | POA: Diagnosis not present

## 2016-06-13 DIAGNOSIS — Z0000 Encounter for general adult medical examination without abnormal findings: Secondary | ICD-10-CM | POA: Diagnosis not present

## 2016-06-13 DIAGNOSIS — H9190 Unspecified hearing loss, unspecified ear: Secondary | ICD-10-CM | POA: Diagnosis not present

## 2016-06-19 DIAGNOSIS — H903 Sensorineural hearing loss, bilateral: Secondary | ICD-10-CM | POA: Diagnosis not present

## 2016-07-12 ENCOUNTER — Other Ambulatory Visit: Payer: Self-pay | Admitting: Cardiology

## 2016-07-12 DIAGNOSIS — D1801 Hemangioma of skin and subcutaneous tissue: Secondary | ICD-10-CM | POA: Diagnosis not present

## 2016-07-12 DIAGNOSIS — L821 Other seborrheic keratosis: Secondary | ICD-10-CM | POA: Diagnosis not present

## 2016-07-12 DIAGNOSIS — D692 Other nonthrombocytopenic purpura: Secondary | ICD-10-CM | POA: Diagnosis not present

## 2016-07-12 DIAGNOSIS — L820 Inflamed seborrheic keratosis: Secondary | ICD-10-CM | POA: Diagnosis not present

## 2016-07-12 DIAGNOSIS — L570 Actinic keratosis: Secondary | ICD-10-CM | POA: Diagnosis not present

## 2016-08-13 ENCOUNTER — Other Ambulatory Visit: Payer: Self-pay | Admitting: Cardiology

## 2016-08-19 DIAGNOSIS — S30860A Insect bite (nonvenomous) of lower back and pelvis, initial encounter: Secondary | ICD-10-CM | POA: Diagnosis not present

## 2016-09-25 DIAGNOSIS — H2511 Age-related nuclear cataract, right eye: Secondary | ICD-10-CM | POA: Diagnosis not present

## 2016-09-25 DIAGNOSIS — H524 Presbyopia: Secondary | ICD-10-CM | POA: Diagnosis not present

## 2016-09-25 DIAGNOSIS — H33022 Retinal detachment with multiple breaks, left eye: Secondary | ICD-10-CM | POA: Diagnosis not present

## 2016-10-02 ENCOUNTER — Other Ambulatory Visit: Payer: Self-pay | Admitting: Cardiology

## 2016-10-02 MED ORDER — ISOSORBIDE MONONITRATE ER 30 MG PO TB24: 30.0000 mg | ORAL_TABLET | Freq: Every day | ORAL | 0 refills | Status: DC

## 2017-01-09 ENCOUNTER — Telehealth: Payer: Self-pay | Admitting: Cardiology

## 2017-01-09 NOTE — Telephone Encounter (Signed)
Patient calling, would like to verify if he needs to have lab work completed before appt on 04-28-17

## 2017-01-09 NOTE — Telephone Encounter (Signed)
Pt's wife aware labs can be drawn the day he comes in for his appt.  She will notify the patient.

## 2017-01-10 DIAGNOSIS — D692 Other nonthrombocytopenic purpura: Secondary | ICD-10-CM | POA: Diagnosis not present

## 2017-01-10 DIAGNOSIS — L821 Other seborrheic keratosis: Secondary | ICD-10-CM | POA: Diagnosis not present

## 2017-01-10 DIAGNOSIS — L570 Actinic keratosis: Secondary | ICD-10-CM | POA: Diagnosis not present

## 2017-01-10 DIAGNOSIS — Z85820 Personal history of malignant melanoma of skin: Secondary | ICD-10-CM | POA: Diagnosis not present

## 2017-01-10 DIAGNOSIS — D1801 Hemangioma of skin and subcutaneous tissue: Secondary | ICD-10-CM | POA: Diagnosis not present

## 2017-02-02 ENCOUNTER — Other Ambulatory Visit: Payer: Self-pay | Admitting: Cardiology

## 2017-02-18 ENCOUNTER — Other Ambulatory Visit: Payer: Self-pay | Admitting: Cardiology

## 2017-02-26 ENCOUNTER — Other Ambulatory Visit: Payer: Self-pay | Admitting: Cardiology

## 2017-02-26 DIAGNOSIS — Z23 Encounter for immunization: Secondary | ICD-10-CM | POA: Diagnosis not present

## 2017-03-09 ENCOUNTER — Other Ambulatory Visit: Payer: Self-pay | Admitting: Cardiology

## 2017-04-28 ENCOUNTER — Ambulatory Visit: Payer: Medicare Other | Admitting: Cardiology

## 2017-06-02 ENCOUNTER — Other Ambulatory Visit: Payer: Self-pay | Admitting: Cardiology

## 2017-06-13 ENCOUNTER — Encounter: Payer: Self-pay | Admitting: Cardiology

## 2017-06-13 ENCOUNTER — Ambulatory Visit (INDEPENDENT_AMBULATORY_CARE_PROVIDER_SITE_OTHER): Payer: Medicare Other | Admitting: Cardiology

## 2017-06-13 VITALS — BP 130/82 | HR 52 | Ht 70.0 in | Wt 190.8 lb

## 2017-06-13 DIAGNOSIS — Z79899 Other long term (current) drug therapy: Secondary | ICD-10-CM | POA: Diagnosis not present

## 2017-06-13 DIAGNOSIS — I208 Other forms of angina pectoris: Secondary | ICD-10-CM

## 2017-06-13 DIAGNOSIS — I2510 Atherosclerotic heart disease of native coronary artery without angina pectoris: Secondary | ICD-10-CM

## 2017-06-13 DIAGNOSIS — E7800 Pure hypercholesterolemia, unspecified: Secondary | ICD-10-CM

## 2017-06-13 DIAGNOSIS — I2583 Coronary atherosclerosis due to lipid rich plaque: Secondary | ICD-10-CM

## 2017-06-13 LAB — LIPID PANEL
CHOLESTEROL TOTAL: 137 mg/dL (ref 100–199)
Chol/HDL Ratio: 2.5 ratio (ref 0.0–5.0)
HDL: 54 mg/dL (ref >39)
LDL Calculated: 63 mg/dL (ref 0–99)
TRIGLYCERIDES: 100 mg/dL (ref 0–149)
VLDL Cholesterol Cal: 20 mg/dL (ref 5–40)

## 2017-06-13 LAB — ALT: ALT: 24 IU/L (ref 0–44)

## 2017-06-13 NOTE — Patient Instructions (Signed)
Medication Instructions:  Please decrease ASA to 81 mg a day. Continue all other medications as listed.  Labwork: Please have blood work today (Lipid/ALT)  Testing/Procedures: None  Follow-Up: Follow up in 1 year with Dr. Marlou Porch.  You will receive a letter in the mail 2 months before you are due.  Please call us when you receive this letter to schedule your follow up appointment.  If you need a refill on your cardiac medications before your next appointment, please call your pharmacy.  Thank you for choosing [facility]!!

## 2017-06-13 NOTE — Progress Notes (Signed)
[place]. [address], [address] [place], [place]  [address] Phone: [phone] Fax:  [phone]  Date:  06/13/2017   ID:  Randy [name], DOB [birth year], MRN [id number]  PCP:  Randy Graff.[name], MD   History of Present Illness: Randy [name] is a 77 y.o. male with coronary artery disease, bare-metal stent to LAD in 2000, cath in 2003 showed patent stent with hyperlipidemia. Former patient of Dr. Leonia Reeves. Last stress test in system 2007 normal EF, no ischemia. Retired doing well. Periodic blurry vision diagnosed as transient visual obscurations at [facility]), MRI normal.  CAD-prior symptoms were dyspnea on exertion. He is not experiencing any of these symptoms. He is doing well. Retired. No NTG. Has occasional palpitations at rest rare. No syncope. Quick burst. Working on rental houses, does his own floors.  Doing very well.  Hyperlipidemia-excellent lipid control previously on Lipitor and Zetia. We will continue with current dosing. He has decided to change to generic Zetia.  He has lost about 10 pounds. Excellent. Been working hard on rental properties.  06/13/17-overall doing really well.  He has not had 1 of those visual changes in the past 6 months.  No chest pain fevers chills nausea vomiting shortness of breath.  Taking medications.    Wt Readings from Last 3 Encounters:  06/13/17 190 lb 12.8 oz (86.5 kg)  04/23/16 198 lb (89.8 kg)  04/03/16 198 lb (89.8 kg)     Past Medical History:  Diagnosis Date  . Arthritis   . CAD (coronary artery disease)   . Hypercholesterolemia   . Melanoma ([place]) 2009  . Rosacea     Past Surgical History:  Procedure Laterality Date  . COLONOSCOPY    . CORONARY ANGIOPLASTY WITH STENT PLACEMENT    . left temporal melanoma    . TONSILLECTOMY      Current Outpatient Medications  Medication Sig Dispense Refill  . aspirin 81 MG tablet Take 162 mg by mouth daily.     Marland Kitchen atorvastatin (LIPITOR) 20 MG tablet TAKE 1 TABLET BY MOUTH EVERY DAY 90 tablet 1   . clindamycin (CLINDAGEL) 1 % gel Apply 1 application topically 2 (two) times daily.    Marland Kitchen ezetimibe (ZETIA) 10 MG tablet Take 1 tablet (10 mg total) by mouth daily. Please keep upcoming appt for future refills. Thank you 30 tablet 0  . isosorbide mononitrate (IMDUR) 30 MG 24 hr tablet Take 1 tablet (30 mg total) by mouth daily. 90 tablet 0  . meloxicam (MOBIC) 15 MG tablet Take 15 mg by mouth as needed for pain.    . Multiple Vitamins-Minerals (CENTRUM CARDIO) TABS Take 2 tablets by mouth daily.     . nitroGLYCERIN (NITROSTAT) 0.4 MG SL tablet Place 1 tablet (0.4 mg total) under the tongue every 5 (five) minutes x 3 doses as needed for chest pain (Max 3 doses in 15 min. call 911). 25 tablet 2  . Vitamin D, Ergocalciferol, (DRISDOL) 50000 UNITS CAPS capsule Take 50,000 Units by mouth once a week.  12   No current facility-administered medications for this visit.     Allergies:   No Known Allergies  Social History:  The patient  reports that he has quit smoking. he has never used smokeless tobacco. He reports that he does not drink alcohol or use drugs.   ROS:  Please see the history of present illness.   Denies any fevers, syncope, orthopnea, PND  PHYSICAL EXAM: VS:  BP 130/82  Pulse (!) 52   Ht 5\' 10"  (1.778 m)   Wt 190 lb 12.8 oz (86.5 kg)   SpO2 97%   BMI 27.38 kg/m  GEN: Well nourished, well developed, in no acute distress  HEENT: normal  Neck: no JVD, carotid bruits, or masses Cardiac: RRR; no murmurs, rubs, or gallops,no edema  Respiratory:  clear to auscultation bilaterally, normal work of breathing GI: soft, nontender, nondistended, + BS MS: no deformity or atrophy  Skin: warm and dry, no rash Neuro:  Alert and Oriented x 3, Strength and sensation are intact Psych: euthymic mood, full affect   EKG:   Today 04/23/16-sinus bradycardia rate 50 borderline intraventricular conduction the leading PR interval 204 ms. Personally viewed-prior 04/21/15-sinus bradycardia with  first-degree AV block heart rate 54, PR interval 222 ms. 04/19/14-sinus rhythm, 64, left axis deviation, no other abnormalities Sinus bradycardia rate 55, he borderline LVH, borderline intraventricular conduction delay     Labs: 07/21/13-creatinine 1.0, potassium 4.5  MRI: 07/24/13-mild small vessel disease brain.  Carotid Doppler: 01/02/15: Less than 50% stenosis in the bilateral internal carotid arteries. There is calcified plaque in both internal carotid bulbs as described above.  ASSESSMENT AND PLAN:  1. Coronary artery disease-currently doing well without any symptoms. No anginal symptoms. Doing very well. Very active. Coronary anatomy reviewed as above.  No changes, stable 2. Transient blurry vision-extensive workup performed here. Neurology decided to send to [facility] for further evaluation. This usually happens once a month. This has been diagnosed at [facility] as transient visual obscurations. Has not had an episode for 1/2 year 3. Hyperlipidemia-under excellent control. Continue with current medication. Last lab work -LDL 69. Continuing combination therapy. He is now taking "generic" Zetia. He would like to know what his cholesterol panel looks like now. Checking lipid profile, ALT. 4. Angina-under good control. At one point we decided to try to pull off of the isosorbide but he did have some anginal symptoms. Continue with low-dose. No dyspnea on exertion which was his anginal equivalent. Stable.  5. 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 Continue with secondary prevention. Stable.  6. One-year follow-up.  Signed, Candee Furbish, MD [facility]  06/13/2017 9:23 AM

## 2017-07-03 DIAGNOSIS — Z23 Encounter for immunization: Secondary | ICD-10-CM | POA: Diagnosis not present

## 2017-07-03 DIAGNOSIS — E7800 Pure hypercholesterolemia, unspecified: Secondary | ICD-10-CM | POA: Diagnosis not present

## 2017-07-03 DIAGNOSIS — Z0000 Encounter for general adult medical examination without abnormal findings: Secondary | ICD-10-CM | POA: Diagnosis not present

## 2017-07-03 DIAGNOSIS — E559 Vitamin D deficiency, unspecified: Secondary | ICD-10-CM | POA: Diagnosis not present

## 2017-07-11 DIAGNOSIS — L820 Inflamed seborrheic keratosis: Secondary | ICD-10-CM | POA: Diagnosis not present

## 2017-07-11 DIAGNOSIS — L821 Other seborrheic keratosis: Secondary | ICD-10-CM | POA: Diagnosis not present

## 2017-07-11 DIAGNOSIS — Z85820 Personal history of malignant melanoma of skin: Secondary | ICD-10-CM | POA: Diagnosis not present

## 2017-07-11 DIAGNOSIS — L570 Actinic keratosis: Secondary | ICD-10-CM | POA: Diagnosis not present

## 2017-07-11 DIAGNOSIS — D2272 Melanocytic nevi of left lower limb, including hip: Secondary | ICD-10-CM | POA: Diagnosis not present

## 2017-07-11 DIAGNOSIS — D1801 Hemangioma of skin and subcutaneous tissue: Secondary | ICD-10-CM | POA: Diagnosis not present

## 2017-07-11 DIAGNOSIS — L812 Freckles: Secondary | ICD-10-CM | POA: Diagnosis not present

## 2017-07-17 ENCOUNTER — Other Ambulatory Visit: Payer: Self-pay | Admitting: Cardiology

## 2017-08-26 ENCOUNTER — Other Ambulatory Visit: Payer: Self-pay | Admitting: Cardiology

## 2017-08-27 ENCOUNTER — Telehealth: Payer: Self-pay | Admitting: Gastroenterology

## 2017-08-27 NOTE — Telephone Encounter (Signed)
Received colon/path report from [facility]. Patient states that he is due for another colonoscopy and is requesting to see Dr. Fuller Plan. Records placed on his desk for review.

## 2017-08-29 ENCOUNTER — Encounter: Payer: Self-pay | Admitting: Gastroenterology

## 2017-08-29 NOTE — Telephone Encounter (Signed)
Dr. Stark reviewed records and has accepted patient. Ok to schedule Direct Colon. Colonoscopy scheduled. °

## 2017-09-14 DIAGNOSIS — S1086XA Insect bite of other specified part of neck, initial encounter: Secondary | ICD-10-CM | POA: Diagnosis not present

## 2017-09-29 ENCOUNTER — Ambulatory Visit (AMBULATORY_SURGERY_CENTER): Payer: Self-pay | Admitting: *Deleted

## 2017-09-29 ENCOUNTER — Other Ambulatory Visit: Payer: Self-pay

## 2017-09-29 VITALS — Ht 70.0 in | Wt 190.0 lb

## 2017-09-29 DIAGNOSIS — Z86010 Personal history of colonic polyps: Secondary | ICD-10-CM

## 2017-09-29 MED ORDER — NA SULFATE-K SULFATE-MG SULF 17.5-3.13-1.6 GM/177ML PO SOLN: 1.0000 | Freq: Once | ORAL | 0 refills | Status: AC

## 2017-09-29 NOTE — Progress Notes (Signed)
Wife present during [place] today. Patient denies any allergies to eggs or soy. Patient denies any problems with anesthesia/sedation. Patient denies any oxygen use at home. Patient denies taking any diet/weight loss medications or blood thinners. EMMI education declined by the pt. Pt wants to drink the Suprep, he understands he may have to pay for this.

## 2017-10-07 DIAGNOSIS — H52203 Unspecified astigmatism, bilateral: Secondary | ICD-10-CM | POA: Diagnosis not present

## 2017-10-07 DIAGNOSIS — H2511 Age-related nuclear cataract, right eye: Secondary | ICD-10-CM | POA: Diagnosis not present

## 2017-10-07 DIAGNOSIS — H33022 Retinal detachment with multiple breaks, left eye: Secondary | ICD-10-CM | POA: Diagnosis not present

## 2017-10-21 ENCOUNTER — Encounter: Payer: Self-pay | Admitting: Gastroenterology

## 2017-10-21 ENCOUNTER — Other Ambulatory Visit: Payer: Self-pay

## 2017-10-21 ENCOUNTER — Ambulatory Visit (AMBULATORY_SURGERY_CENTER): Payer: Medicare Other | Admitting: Gastroenterology

## 2017-10-21 VITALS — BP 126/71 | HR 58 | Temp 98.0°F | Resp 14 | Ht 70.0 in | Wt 190.0 lb

## 2017-10-21 DIAGNOSIS — Z86010 Personal history of colonic polyps: Secondary | ICD-10-CM | POA: Diagnosis not present

## 2017-10-21 DIAGNOSIS — Z1211 Encounter for screening for malignant neoplasm of colon: Secondary | ICD-10-CM | POA: Diagnosis not present

## 2017-10-21 DIAGNOSIS — D123 Benign neoplasm of transverse colon: Secondary | ICD-10-CM

## 2017-10-21 MED ORDER — SODIUM CHLORIDE 0.9 % IV SOLN: 500.0000 mL | Freq: Once | INTRAVENOUS | Status: AC

## 2017-10-21 NOTE — Progress Notes (Signed)
Pt's states no medical or surgical changes since previsit or office visit. 

## 2017-10-21 NOTE — Progress Notes (Signed)
A and O x3. Report to RN. Tolerated MAC anesthesia well.

## 2017-10-21 NOTE — Patient Instructions (Signed)
Handouts given:  High Fiber Diet, Polyps, Hemorrhoids, and Diverticulosis.     YOU HAD AN ENDOSCOPIC PROCEDURE TODAY AT [facility]:   Refer to the procedure report that was given to you for any specific questions about what was found during the examination.  If the procedure report does not answer your questions, please call your gastroenterologist to clarify.  If you requested that your care partner not be given the details of your procedure findings, then the procedure report has been included in a sealed envelope for you to review at your convenience later.  YOU SHOULD EXPECT: Some feelings of bloating in the abdomen. Passage of more gas than usual.  Walking can help get rid of the air that was put into your GI tract during the procedure and reduce the bloating. If you had a lower endoscopy (such as a colonoscopy or flexible sigmoidoscopy) you may notice spotting of blood in your stool or on the toilet paper. If you underwent a bowel prep for your procedure, you may not have a normal bowel movement for a few days.  Please Note:  You might notice some irritation and congestion in your nose or some drainage.  This is from the oxygen used during your procedure.  There is no need for concern and it should clear up in a day or so.  SYMPTOMS TO REPORT IMMEDIATELY:   Following lower endoscopy (colonoscopy or flexible sigmoidoscopy):  Excessive amounts of blood in the stool  Significant tenderness or worsening of abdominal pains  Swelling of the abdomen that is new, acute  Fever of 100F or higher  For urgent or emergent issues, a gastroenterologist can be reached at any hour by calling [phone].   DIET:  We do recommend a small meal at first, but then you may proceed to your regular diet.  Drink plenty of fluids but you should avoid alcoholic beverages for 24 hours.  ACTIVITY:  You should plan to take it easy for the rest of today and you should NOT DRIVE or use heavy  machinery until tomorrow (because of the sedation medicines used during the test).    FOLLOW UP: Our staff will call the number listed on your records the next business day following your procedure to check on you and address any questions or concerns that you may have regarding the information given to you following your procedure. If we do not reach you, we will leave a message.  However, if you are feeling well and you are not experiencing any problems, there is no need to return our call.  We will assume that you have returned to your regular daily activities without incident.  If any biopsies were taken you will be contacted by phone or by letter within the next 1-3 weeks.  Please call us at [phone] if you have not heard about the biopsies in 3 weeks.    SIGNATURES/CONFIDENTIALITY: You and/or your care partner have signed paperwork which will be entered into your electronic medical record.  These signatures attest to the fact that that the information above on your After Visit Summary has been reviewed and is understood.  Full responsibility of the confidentiality of this discharge information lies with you and/or your care-partner.

## 2017-10-21 NOTE — Op Note (Signed)
[place] Patient Name: Randy [name] Procedure Date: 10/21/2017 1:25 PM MRN: [id number] Endoscopist: Ladene Artist , MD Age: 77 Referring MD:  Date of Birth: [birth year] Gender: Male Account #: [id number] Procedure:                Colonoscopy Indications:              High risk colon cancer surveillance: Personal                            history of traditional serrated adenoma of the colon Medicines:                Monitored Anesthesia Care Procedure:                Pre-Anesthesia Assessment:                           - Prior to the procedure, a History and Physical                            was performed, and patient medications and                            allergies were reviewed. The patient's tolerance of                            previous anesthesia was also reviewed. The risks                            and benefits of the procedure and the sedation                            options and risks were discussed with the patient.                            All questions were answered, and informed consent                            was obtained. Prior Anticoagulants: The patient has                            taken no previous anticoagulant or antiplatelet                            agents. ASA Grade Assessment: II - A patient with                            mild systemic disease. After reviewing the risks                            and benefits, the patient was deemed in                            satisfactory condition to undergo the procedure.  After obtaining informed consent, the colonoscope                            was passed under direct vision. Throughout the                            procedure, the patient's blood pressure, pulse, and                            oxygen saturations were monitored continuously. The                            Colonoscope was introduced through the anus and                            advanced to the  the cecum, identified by                            appendiceal orifice and ileocecal valve. The                            ileocecal valve, appendiceal orifice, and rectum                            were photographed. The quality of the bowel                            preparation was good. The colonoscopy was performed                            without difficulty. The patient tolerated the                            procedure well. Scope In: 1:27:56 PM Scope Out: 1:43:50 PM Scope Withdrawal Time: 0 hours 11 minutes 53 seconds  Total Procedure Duration: 0 hours 15 minutes 54 seconds  Findings:                 The perianal and digital rectal examinations were                            normal.                           Two sessile polyps were found in the transverse                            colon. The polyps were 5 to 6 mm in size. These                            polyps were removed with a cold snare. Resection                            and retrieval were complete.  A few small-mouthed diverticula were found in the                            sigmoid colon.                           Internal hemorrhoids were found during                            retroflexion. The hemorrhoids were small and Grade                            I (internal hemorrhoids that do not prolapse).                           The exam was otherwise without abnormality on                            direct and retroflexion views. Complications:            No immediate complications. Estimated blood loss:                            None. Estimated Blood Loss:     Estimated blood loss: none. Impression:               - Two 5 to 6 mm polyps in the transverse colon,                            removed with a cold snare. Resected and retrieved.                           - Diverticulosis in the sigmoid colon.                           - Internal hemorrhoids.                           - The  examination was otherwise normal on direct                            and retroflexion views. Recommendation:           - Patient has a contact number available for                            emergencies. The signs and symptoms of potential                            delayed complications were discussed with the                            patient. Return to normal activities tomorrow.                            Written discharge instructions were provided to the  patient.                           - High fiber diet.                           - Continue present medications.                           - Await pathology results.                           - No repeat colonoscopy due to age. Ladene Artist, MD 10/21/2017 1:47:27 PM This report has been signed electronically.

## 2017-10-21 NOTE — Progress Notes (Signed)
Called to room to assist during endoscopic procedure.  Patient ID and intended procedure confirmed with present staff. Received instructions for my participation in the procedure from the performing physician.  

## 2017-10-22 ENCOUNTER — Telehealth: Payer: Self-pay

## 2017-10-22 NOTE — Telephone Encounter (Signed)
  Follow up Call-  Call back number 10/21/2017  Post procedure Call Back phone  # [phone]  Permission to leave phone message Yes  Some recent data might be hidden     Patient questions:  Do you have a fever, pain , or abdominal swelling? No. Pain Score  0 *  Have you tolerated food without any problems? Yes.    Have you been able to return to your normal activities? Yes.    Do you have any questions about your discharge instructions: Diet   No. Medications  No. Follow up visit  No.  Do you have questions or concerns about your Care? No.  Actions: * If pain score is 4 or above: No action needed, pain <4.  No problems noted per pt. maw

## 2017-11-02 ENCOUNTER — Encounter: Payer: Self-pay | Admitting: Gastroenterology

## 2018-01-09 DIAGNOSIS — L570 Actinic keratosis: Secondary | ICD-10-CM | POA: Diagnosis not present

## 2018-01-09 DIAGNOSIS — D1801 Hemangioma of skin and subcutaneous tissue: Secondary | ICD-10-CM | POA: Diagnosis not present

## 2018-01-09 DIAGNOSIS — Z85820 Personal history of malignant melanoma of skin: Secondary | ICD-10-CM | POA: Diagnosis not present

## 2018-01-09 DIAGNOSIS — L821 Other seborrheic keratosis: Secondary | ICD-10-CM | POA: Diagnosis not present

## 2018-01-09 DIAGNOSIS — L820 Inflamed seborrheic keratosis: Secondary | ICD-10-CM | POA: Diagnosis not present

## 2018-01-09 DIAGNOSIS — D692 Other nonthrombocytopenic purpura: Secondary | ICD-10-CM | POA: Diagnosis not present

## 2018-02-20 DIAGNOSIS — Z23 Encounter for immunization: Secondary | ICD-10-CM | POA: Diagnosis not present

## 2018-03-10 ENCOUNTER — Other Ambulatory Visit: Payer: Self-pay | Admitting: Cardiology

## 2018-03-28 ENCOUNTER — Other Ambulatory Visit: Payer: Self-pay | Admitting: Cardiology

## 2018-05-08 ENCOUNTER — Other Ambulatory Visit: Payer: Self-pay | Admitting: Cardiology

## 2018-06-08 ENCOUNTER — Encounter: Payer: Self-pay | Admitting: Cardiology

## 2018-06-08 ENCOUNTER — Ambulatory Visit (INDEPENDENT_AMBULATORY_CARE_PROVIDER_SITE_OTHER): Payer: Medicare Other | Admitting: Cardiology

## 2018-06-08 VITALS — BP 120/80 | HR 59 | Ht 70.0 in | Wt 187.4 lb

## 2018-06-08 DIAGNOSIS — E7800 Pure hypercholesterolemia, unspecified: Secondary | ICD-10-CM

## 2018-06-08 DIAGNOSIS — I2510 Atherosclerotic heart disease of native coronary artery without angina pectoris: Secondary | ICD-10-CM | POA: Diagnosis not present

## 2018-06-08 DIAGNOSIS — I208 Other forms of angina pectoris: Secondary | ICD-10-CM | POA: Diagnosis not present

## 2018-06-08 DIAGNOSIS — I2583 Coronary atherosclerosis due to lipid rich plaque: Secondary | ICD-10-CM | POA: Diagnosis not present

## 2018-06-08 LAB — LIPID PANEL
Chol/HDL Ratio: 2.6 ratio (ref 0.0–5.0)
Cholesterol, Total: 138 mg/dL (ref 100–199)
HDL: 53 mg/dL (ref >39)
LDL Calculated: 71 mg/dL (ref 0–99)
Triglycerides: 70 mg/dL (ref 0–149)
VLDL CHOLESTEROL CAL: 14 mg/dL (ref 5–40)

## 2018-06-08 NOTE — Progress Notes (Signed)
Cardiology Office Note:    Date:  06/08/2018   ID:  Randy [name], DOB 1940-12-16, MRN [id number]  PCP:  Aurea Graff.Randy Sa, MD  Cardiologist:  Candee Furbish, MD  Electrophysiologist:  None   Referring MD: Aurea Graff.Randy Sa, MD     History of Present Illness:    Randy [name] is a 78 y.o. male here for follow-up of coronary artery disease status post bare-metal stent to LAD in 2000 with patent stent verification in 2003 catheterization.  Former patient of Dr. Leonia [name].  Nuclear stress test 2007 no ischemia.  Had previously had blurry vision diagnosed as transient visual obscurations at [facility], his MRI was normal.  This was several years ago.  Prior anginal symptoms were dyspnea on exertion.  Overall doing well.  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  Has history of taking statin as well as Zetia.  Working on [employer].  Denies any fevers chills nausea vomiting syncope bleeding.  Past Medical History:  Diagnosis Date  . Arthritis   . CAD (coronary artery disease)   . Hypercholesterolemia   . Melanoma ([place]) 2009   melanoma head  . Rosacea     Past Surgical History:  Procedure Laterality Date  . COLONOSCOPY    . CORONARY ANGIOPLASTY WITH STENT PLACEMENT  2000  . left temporal melanoma    . TONSILLECTOMY      Current Medications: Current Meds  Medication Sig  . atorvastatin (LIPITOR) 20 MG tablet Take 1 tablet (20 mg total) by mouth daily.  . clindamycin (CLINDAGEL) 1 % gel Apply 1 application topically 2 (two) times daily.  Marland Kitchen ezetimibe (ZETIA) 10 MG tablet Take 1 tablet (10 mg total) by mouth daily. Please keep upcoming appt with Dr. Marlou [name] for January for future refills. Thank you  . isosorbide mononitrate (IMDUR) 30 MG 24 hr tablet TAKE 1 TABLET BY MOUTH EVERY DAY  . meloxicam (MOBIC) 15 MG tablet Take 15 mg by mouth as needed for pain.  . Multiple Vitamins-Minerals (CENTRUM CARDIO) TABS Take 2 tablets by mouth daily.   . nitroGLYCERIN (NITROSTAT) 0.4 MG SL tablet  PLACE 1 TABLET UNDER TONGUE EVERY 5 MINUTES X3 DOSES AS NEEDED FOR CHEST PAIN - MAX 3 DOSES-CALL 911   Current Facility-Administered Medications for the 06/08/18 encounter (Office Visit) with Jerline Pain, MD  Medication  . 0.9 %  sodium chloride infusion     Allergies:   Patient has no known allergies.   Social History   Socioeconomic History  . Marital status: Married    Spouse name: Not on file  . Number of children: Not on file  . Years of education: Not on file  . Highest education level: Not on file  Occupational History  . Not on file  Social Needs  . Financial resource strain: Not on file  . Food insecurity:    Worry: Not on file    Inability: Not on file  . Transportation needs:    Medical: Not on file    Non-medical: Not on file  Tobacco Use  . Smoking status: Former Research scientist (life sciences)  . Smokeless tobacco: Never Used  . Tobacco comment: patient had quit for about 25yrs ago  Substance and Sexual Activity  . Alcohol use: No    Alcohol/week: 0.0 standard drinks  . Drug use: No  . Sexual activity: Not on file  Lifestyle  . Physical activity:    Days per week: Not on file    Minutes per session: Not on file  .  Stress: Not on file  Relationships  . Social connections:    Talks on phone: Not on file    Gets together: Not on file    Attends religious service: Not on file    Active member of club or organization: Not on file    Attends meetings of clubs or organizations: Not on file    Relationship status: Not on file  Other Topics Concern  . Not on file  Social History Narrative   Lives with wife in a one story home.  Has 2 children.     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    Education: high school     Family History: The patient's family history includes Diabetes in his sister; Healthy in his daughter; Heart attack in his father and sister; Multiple sclerosis in his son; Stomach cancer in his mother.  There is no history of Colon cancer or Esophageal cancer.  ROS:   Please see the history of present illness.     All other systems reviewed and are negative.  EKGs/Labs/Other Studies Reviewed:    The following studies were reviewed today: Last LDL 63. Colonoscopy has been performed.  EKG:  EKG is ordered today, sinus bradycardia 59 otherwise normal no other abnormalities.  The ekg ordered today demonstrates prior EKG from 06/13/2017 shows sinus bradycardia 52, incomplete right bundle branch block, otherwise normal.  Recent Labs: 06/13/2017: ALT 24  Recent Lipid Panel    Component Value Date/Time   CHOL 137 06/13/2017 0937   TRIG 100 06/13/2017 0937   HDL 54 06/13/2017 0937   CHOLHDL 2.5 06/13/2017 0937   CHOLHDL 2.6 04/23/2016 0825   VLDL 20 04/23/2016 0825   LDLCALC 63 06/13/2017 0937    Physical Exam:    VS:  BP 120/80   Pulse (!) 59   Ht 5\' 10"  (1.778 m)   Wt 187 lb 6.4 oz (85 kg)   BMI 26.89 kg/m     Wt Readings from Last 3 Encounters:  06/08/18 187 lb 6.4 oz (85 kg)  10/21/17 190 lb (86.2 kg)  09/29/17 190 lb (86.2 kg)     GEN:  Well nourished, well developed in no acute distress HEENT: Normal NECK: No JVD; No carotid bruits LYMPHATICS: No lymphadenopathy CARDIAC: RRR, no murmurs, rubs, gallops RESPIRATORY:  Clear to auscultation without rales, wheezing or rhonchi  ABDOMEN: Soft, non-tender, non-distended MUSCULOSKELETAL:  No edema; No deformity  SKIN: Warm and dry NEUROLOGIC:  Alert and oriented x 3 PSYCHIATRIC:  Normal affect   ASSESSMENT:    1. Coronary artery disease due to lipid rich plaque   2. Hypercholesterolemia   3. Angina decubitus (HCC)    PLAN:    In order of problems listed above:  Coronary artery disease - Prior LAD bare-metal stent 2000.  Overall doing well without any exertional anginal symptoms such as shortness of breath.  Continue with aggressive secondary risk factor prevention.  Overall doing very well.  Continue to promote  exercise.  Hyperlipidemia - LDL 63.  Medications reviewed as above.  Liver function previously normal.  We will check lipid panel.  Angina - Overall well controlled with low-dose isosorbide.  No dyspnea on exertion which was anginal equivalent.  At one time we tried to pull off the isosorbide but he began to have some symptoms.  Carotid stenosis -Less than 50% bilaterally, secondary prevention, asymptomatic.  Prior work-up with MRI of brain during episode of transient visual obscuration at [facility].  No bruits heard on exam.   Medication Adjustments/Labs and Tests Ordered: Current medicines are reviewed at length with the patient today.  Concerns regarding medicines are outlined above.  Orders Placed This Encounter  Procedures  . Lipid panel  . EKG 12-Lead   No orders of the defined types were placed in this encounter.   Patient Instructions  Medication Instructions:  The current medical regimen is effective;  continue present plan and medications.  If you need a refill on your cardiac medications before your next appointment, please call your pharmacy.   Lab work: Please have blood work today (lipid)  Follow-Up: At [facility], you and your health needs are our priority.  As part of our continuing mission to provide you with exceptional heart care, we have created designated Provider Care Teams.  These Care Teams include your primary Cardiologist (physician) and Advanced Practice Providers (APPs -  Physician Assistants and Nurse Practitioners) who all work together to provide you with the care you need, when you need it. You will need a follow up appointment in 12 months.  Please call our office 2 months in advance to schedule this appointment.  You may see Candee Furbish, MD or one of the following Advanced Practice Providers on your designated Care Team:   Truitt Merle, NP Cecilie Kicks, NP . Kathyrn Drown, NP  Thank you for choosing [facility]!!         Signed, Candee Furbish, MD  06/08/2018 8:26 AM    [place]

## 2018-06-08 NOTE — Patient Instructions (Signed)
Medication Instructions:  The current medical regimen is effective;  continue present plan and medications.  If you need a refill on your cardiac medications before your next appointment, please call your pharmacy.   Lab work: Please have blood work today (lipid)  Follow-Up: At [facility], you and your health needs are our priority.  As part of our continuing mission to provide you with exceptional heart care, we have created designated Provider Care Teams.  These Care Teams include your primary Cardiologist (physician) and Advanced Practice Providers (APPs -  Physician Assistants and Nurse Practitioners) who all work together to provide you with the care you need, when you need it. You will need a follow up appointment in 12 months.  Please call our office 2 months in advance to schedule this appointment.  You may see Candee Furbish, MD or one of the following Advanced Practice Providers on your designated Care Team:   Truitt Merle, NP Cecilie Kicks, NP . Kathyrn Drown, NP  Thank you for choosing [facility]!!

## 2018-06-13 ENCOUNTER — Other Ambulatory Visit: Payer: Self-pay | Admitting: Cardiology

## 2018-06-15 ENCOUNTER — Ambulatory Visit: Payer: Medicare Other | Admitting: Cardiology

## 2018-06-15 DIAGNOSIS — L309 Dermatitis, unspecified: Secondary | ICD-10-CM | POA: Diagnosis not present

## 2018-07-16 ENCOUNTER — Other Ambulatory Visit: Payer: Self-pay | Admitting: Cardiology

## 2018-07-24 DIAGNOSIS — D1801 Hemangioma of skin and subcutaneous tissue: Secondary | ICD-10-CM | POA: Diagnosis not present

## 2018-07-24 DIAGNOSIS — D692 Other nonthrombocytopenic purpura: Secondary | ICD-10-CM | POA: Diagnosis not present

## 2018-07-24 DIAGNOSIS — L570 Actinic keratosis: Secondary | ICD-10-CM | POA: Diagnosis not present

## 2018-07-24 DIAGNOSIS — L821 Other seborrheic keratosis: Secondary | ICD-10-CM | POA: Diagnosis not present

## 2018-07-24 DIAGNOSIS — L309 Dermatitis, unspecified: Secondary | ICD-10-CM | POA: Diagnosis not present

## 2018-07-24 DIAGNOSIS — L280 Lichen simplex chronicus: Secondary | ICD-10-CM | POA: Diagnosis not present

## 2018-07-24 DIAGNOSIS — Z85820 Personal history of malignant melanoma of skin: Secondary | ICD-10-CM | POA: Diagnosis not present

## 2018-08-15 ENCOUNTER — Other Ambulatory Visit: Payer: Self-pay | Admitting: Cardiology

## 2018-09-10 ENCOUNTER — Other Ambulatory Visit: Payer: Self-pay | Admitting: Cardiology

## 2018-11-14 ENCOUNTER — Other Ambulatory Visit: Payer: Self-pay | Admitting: Cardiology

## 2019-01-20 DIAGNOSIS — L570 Actinic keratosis: Secondary | ICD-10-CM | POA: Diagnosis not present

## 2019-01-20 DIAGNOSIS — L821 Other seborrheic keratosis: Secondary | ICD-10-CM | POA: Diagnosis not present

## 2019-01-20 DIAGNOSIS — D2272 Melanocytic nevi of left lower limb, including hip: Secondary | ICD-10-CM | POA: Diagnosis not present

## 2019-01-20 DIAGNOSIS — D1801 Hemangioma of skin and subcutaneous tissue: Secondary | ICD-10-CM | POA: Diagnosis not present

## 2019-01-20 DIAGNOSIS — D2271 Melanocytic nevi of right lower limb, including hip: Secondary | ICD-10-CM | POA: Diagnosis not present

## 2019-01-20 DIAGNOSIS — L820 Inflamed seborrheic keratosis: Secondary | ICD-10-CM | POA: Diagnosis not present

## 2019-01-30 DIAGNOSIS — Z23 Encounter for immunization: Secondary | ICD-10-CM | POA: Diagnosis not present

## 2019-04-11 ENCOUNTER — Other Ambulatory Visit: Payer: Self-pay | Admitting: Cardiology

## 2019-05-02 DIAGNOSIS — Z20828 Contact with and (suspected) exposure to other viral communicable diseases: Secondary | ICD-10-CM | POA: Diagnosis not present

## 2019-05-15 ENCOUNTER — Other Ambulatory Visit: Payer: Self-pay | Admitting: Cardiology

## 2019-07-20 ENCOUNTER — Encounter: Payer: Self-pay | Admitting: Cardiology

## 2019-07-20 ENCOUNTER — Other Ambulatory Visit: Payer: Self-pay

## 2019-07-20 ENCOUNTER — Ambulatory Visit (INDEPENDENT_AMBULATORY_CARE_PROVIDER_SITE_OTHER): Payer: Medicare Other | Admitting: Cardiology

## 2019-07-20 VITALS — BP 120/70 | HR 54 | Ht 70.0 in | Wt 184.8 lb

## 2019-07-20 DIAGNOSIS — I2583 Coronary atherosclerosis due to lipid rich plaque: Secondary | ICD-10-CM

## 2019-07-20 DIAGNOSIS — I208 Other forms of angina pectoris: Secondary | ICD-10-CM | POA: Diagnosis not present

## 2019-07-20 DIAGNOSIS — E7800 Pure hypercholesterolemia, unspecified: Secondary | ICD-10-CM | POA: Diagnosis not present

## 2019-07-20 DIAGNOSIS — Z79899 Other long term (current) drug therapy: Secondary | ICD-10-CM

## 2019-07-20 DIAGNOSIS — I2510 Atherosclerotic heart disease of native coronary artery without angina pectoris: Secondary | ICD-10-CM

## 2019-07-20 LAB — CBC
Hematocrit: 41.3 % (ref 37.5–51.0)
Hemoglobin: 14.6 g/dL (ref 13.0–17.7)
MCH: 33 pg (ref 26.6–33.0)
MCHC: 35.4 g/dL (ref 31.5–35.7)
MCV: 93 fL (ref 79–97)
Platelets: 144 10*3/uL — ABNORMAL LOW (ref 150–450)
RBC: 4.42 x10E6/uL (ref 4.14–5.80)
RDW: 12.2 % (ref 11.6–15.4)
WBC: 4.7 10*3/uL (ref 3.4–10.8)

## 2019-07-20 LAB — BASIC METABOLIC PANEL
BUN/Creatinine Ratio: 14 (ref 10–24)
BUN: 13 mg/dL (ref 8–27)
CO2: 24 mmol/L (ref 20–29)
Calcium: 9.5 mg/dL (ref 8.6–10.2)
Chloride: 97 mmol/L (ref 96–106)
Creatinine, Ser: 0.95 mg/dL (ref 0.76–1.27)
GFR calc Af Amer: 89 mL/min/{1.73_m2} (ref >59)
GFR calc non Af Amer: 77 mL/min/{1.73_m2} (ref >59)
Glucose: 87 mg/dL (ref 65–99)
Potassium: 4.4 mmol/L (ref 3.5–5.2)
Sodium: 135 mmol/L (ref 134–144)

## 2019-07-20 LAB — ALT: ALT: 31 IU/L (ref 0–44)

## 2019-07-20 LAB — LIPID PANEL
Chol/HDL Ratio: 2.3 ratio (ref 0.0–5.0)
Cholesterol, Total: 130 mg/dL (ref 100–199)
HDL: 56 mg/dL (ref >39)
LDL Chol Calc (NIH): 57 mg/dL (ref 0–99)
Triglycerides: 89 mg/dL (ref 0–149)
VLDL Cholesterol Cal: 17 mg/dL (ref 5–40)

## 2019-07-20 MED ORDER — ASPIRIN EC 81 MG PO TBEC: 81.0000 mg | DELAYED_RELEASE_TABLET | Freq: Every day | ORAL | 3 refills | Status: DC

## 2019-07-20 NOTE — Progress Notes (Signed)
Cardiology Office Note:    Date:  07/20/2019   ID:  Randy [name], DOB 05-14-1941, MRN [id number]  PCP:  Aurea Graff.[name], MD  Cardiologist:  Candee Furbish, MD  Electrophysiologist:  None   Referring MD: Aurea Graff.[name], MD     History of Present Illness:    Randy [name] is a 79 y.o. male here for follow-up coronary artery disease post bare-metal stent LAD in 2000 with subsequent cardiac catheterization 2003 showing patent stent.  Former patient of Dr. Leonia Reeves.  Back in 2007 had a stress test with no ischemia.  Previously had some blurry vision diagnosis transient visual obscurations at [facility].  MRI of brain was normal.  This was several years ago.  Prior angina was dyspnea on exertion.  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  Has taken statin as well as Zetia.  Past Medical History:  Diagnosis Date  . Arthritis   . CAD (coronary artery disease)   . Hypercholesterolemia   . 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 Rosacea     Past Surgical History:  Procedure Laterality Date  . COLONOSCOPY    . CORONARY ANGIOPLASTY WITH STENT PLACEMENT  2000  . left temporal melanoma    . TONSILLECTOMY      Current Medications: Current Meds  Medication Sig  . atorvastatin (LIPITOR) 20 MG tablet TAKE 1 TABLET BY MOUTH EVERY DAY  . clindamycin (CLINDAGEL) 1 % gel Apply 1 application topically 2 (two) times daily.  Marland Kitchen ezetimibe (ZETIA) 10 MG tablet TAKE ONE TABLET BY MOUTH DAILY  . isosorbide mononitrate (IMDUR) 30 MG 24 hr tablet TAKE 1 TABLET BY MOUTH EVERY DAY  . meloxicam (MOBIC) 15 MG tablet Take 15 mg by mouth as needed for pain.  . Multiple Vitamins-Minerals (CENTRUM CARDIO) TABS Take 2 tablets by mouth daily.   . nitroGLYCERIN (NITROSTAT) 0.4 MG SL tablet PLACE 1 TABLET UNDER TONGUE EVERY 5 MINUTES X3 DOSES AS NEEDED FOR CHEST PAIN - MAX 3 DOSES-CALL 911   Current Facility-Administered Medications for the 07/20/19 encounter (Office Visit) with Jerline Pain, MD  Medication  . 0.9 %   sodium chloride infusion     Allergies:   Patient has no known allergies.   Social History   Socioeconomic History  . Marital status: Married    Spouse name: Not on file  . Number of children: Not on file  . Years of education: Not on file  . Highest education level: Not on file  Occupational History  . Not on file  Tobacco Use  . Smoking status: Former Research scientist (life sciences)  . Smokeless tobacco: Never Used  . Tobacco comment: patient had quit for about 103yrs ago  Substance and Sexual Activity  . Alcohol use: No    Alcohol/week: 0.0 standard drinks  . Drug use: No  . Sexual activity: Not on file  Other Topics Concern  . Not on file  Social History Narrative   Lives with wife in a one story home.  Has 2 children.     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    Education: high school   Social Determinants of Radio broadcast assistant Strain:   . Difficulty of Paying Living Expenses: Not on file  Food Insecurity:   . Worried About Charity fundraiser in the Last Year: Not on file  . Ran Out of Food in the Last Year: Not on file  Transportation Needs:   . Lack  of Transportation (Medical): Not on file  . Lack of Transportation (Non-Medical): Not on file  Physical Activity:   . Days of Exercise per Week: Not on file  . Minutes of Exercise per Session: Not on file  Stress:   . Feeling of Stress : Not on file  Social Connections:   . Frequency of Communication with Friends and Family: Not on file  . Frequency of Social Gatherings with Friends and Family: Not on file  . Attends Religious Services: Not on file  . Active Member of Clubs or Organizations: Not on file  . Attends Archivist Meetings: Not on file  . Marital Status: Not on file     Family History: The patient's family history includes Diabetes in his sister; Healthy in his daughter; Heart attack in his father and sister; Multiple sclerosis in his son;  Stomach cancer in his mother. There is no history of Colon cancer or Esophageal cancer.  ROS:   Please see the history of present illness.    No fevers chills nausea vomiting syncope bleeding all other systems reviewed and are negative.  EKGs/Labs/Other Studies Reviewed:    The following studies were reviewed today: Catheterization as above  EKG: 07/20/2019-new EKG today shows sinus bradycardia 54 with no other abnormalities 06/08/2018-sinus bradycardia 59 with no other abnormalities.  Recent Labs: No results found for requested labs within last 8760 hours.  Recent Lipid Panel    Component Value Date/Time   CHOL 138 06/08/2018 0820   TRIG 70 06/08/2018 0820   HDL 53 06/08/2018 0820   CHOLHDL 2.6 06/08/2018 0820   CHOLHDL 2.6 04/23/2016 0825   VLDL 20 04/23/2016 0825   LDLCALC 71 06/08/2018 0820    Physical Exam:    VS:  BP 120/70   Pulse (!) 54   Ht 5\' 10"  (1.778 m)   Wt 184 lb 12.8 oz (83.8 kg)   SpO2 98%   BMI 26.52 kg/m     Wt Readings from Last 3 Encounters:  07/20/19 184 lb 12.8 oz (83.8 kg)  06/08/18 187 lb 6.4 oz (85 kg)  10/21/17 190 lb (86.2 kg)     GEN:  Well nourished, well developed in no acute distress HEENT: Normal NECK: No JVD; No carotid bruits LYMPHATICS: No lymphadenopathy CARDIAC: RRR, no murmurs, rubs, gallops RESPIRATORY:  Clear to auscultation without rales, wheezing or rhonchi  ABDOMEN: Soft, non-tender, non-distended MUSCULOSKELETAL:  No edema; No deformity  SKIN: Warm and dry NEUROLOGIC:  Alert and oriented x 3 PSYCHIATRIC:  Normal affect   ASSESSMENT:    1. Coronary artery disease due to lipid rich plaque   2. Hypercholesterolemia   3. Angina decubitus ([place])   4. Medication management    PLAN:    In order of problems listed above:  Coronary artery disease -Bare-metal stent LAD 2000.  No anginal symptoms of shortness of breath.  Aggressive secondary risk factor prevention.  Doing well.  Exercise. No changes made other than  asking him to take aspirin 81 mg on a more regular basis. We will check a CBC. Basic metabolic profile.  Hyperlipidemia -Prior LDL 63-71, continue to encourage statin, Zetia use.  Excellent.  No myalgias.  ALT 24, we will go ahead and check a lipid panel and an ALT  Angina -Once again shortness of breath main symptom.  Very well controlled on low-dose isosorbide.  One time we tried to stop the isosorbide but he began having more symptoms.  We will continue.  Carotid stenosis -Less  than 50% bilaterally.  Asymptomatic.  Secondary prevention.  No bruits.  Prior work-up with MRI brain during episode of transient visual obscuration at [facility].  Medication Adjustments/Labs and Tests Ordered: Current medicines are reviewed at length with the patient today.  Concerns regarding medicines are outlined above.  Orders Placed This Encounter  Procedures  . ALT  . Basic metabolic panel  . CBC  . Lipid panel  . EKG 12-Lead   Meds ordered this encounter  Medications  . aspirin EC 81 MG tablet    Sig: Take 1 tablet (81 mg total) by mouth daily.    Dispense:  90 tablet    Refill:  3    Patient Instructions  Medication Instructions:  Please take Aspirin 81 mg daily. Continue all other medications as listed.  *If you need a refill on your cardiac medications before your next appointment, please call your pharmacy*   Lab Work: Please have blood work today. (Lipid,ALT,BMP/CBC)  If you have labs (blood work) drawn today and your tests are completely normal, you will receive your results only by: Marland Kitchen MyChart Message (if you have MyChart) OR . A paper copy in the mail If you have any lab test that is abnormal or we need to change your treatment, we will call you to review the results.  Follow-Up: At [facility], you and your health needs are our priority.  As part of our continuing mission to provide you with exceptional heart care, we have created designated Provider Care Teams.  These Care Teams  include your primary Cardiologist (physician) and Advanced Practice Providers (APPs -  Physician Assistants and Nurse Practitioners) who all work together to provide you with the care you need, when you need it.  We recommend signing up for the patient portal called "MyChart".  Sign up information is provided on this After Visit Summary.  MyChart is used to connect with patients for Virtual Visits (Telemedicine).  Patients are able to view lab/test results, encounter notes, upcoming appointments, etc.  Non-urgent messages can be sent to your provider as well.   To learn more about what you can do with MyChart, go to [email or web address].    Your next appointment:   12 month(s)  The format for your next appointment:   In Person  Provider:   Candee Furbish, MD   Thank you for choosing [facility]!!         Signed, Candee Furbish, MD  07/20/2019 9:16 AM    [place]

## 2019-07-20 NOTE — Patient Instructions (Signed)
Medication Instructions:  Please take Aspirin 81 mg daily. Continue all other medications as listed.  *If you need a refill on your cardiac medications before your next appointment, please call your pharmacy*   Lab Work: Please have blood work today. (Lipid,ALT,BMP/CBC)  If you have labs (blood work) drawn today and your tests are completely normal, you will receive your results only by: Marland Kitchen MyChart Message (if you have MyChart) OR . A paper copy in the mail If you have any lab test that is abnormal or we need to change your treatment, we will call you to review the results.  Follow-Up: At [facility], you and your health needs are our priority.  As part of our continuing mission to provide you with exceptional heart care, we have created designated Provider Care Teams.  These Care Teams include your primary Cardiologist (physician) and Advanced Practice Providers (APPs -  Physician Assistants and Nurse Practitioners) who all work together to provide you with the care you need, when you need it.  We recommend signing up for the patient portal called "MyChart".  Sign up information is provided on this After Visit Summary.  MyChart is used to connect with patients for Virtual Visits (Telemedicine).  Patients are able to view lab/test results, encounter notes, upcoming appointments, etc.  Non-urgent messages can be sent to your provider as well.   To learn more about what you can do with MyChart, go to [email or web address].    Your next appointment:   12 month(s)  The format for your next appointment:   In Person  Provider:   Candee Furbish, MD   Thank you for choosing [facility]!!

## 2019-07-21 ENCOUNTER — Encounter: Payer: Self-pay | Admitting: *Deleted

## 2019-07-21 DIAGNOSIS — L821 Other seborrheic keratosis: Secondary | ICD-10-CM | POA: Diagnosis not present

## 2019-07-21 DIAGNOSIS — D1801 Hemangioma of skin and subcutaneous tissue: Secondary | ICD-10-CM | POA: Diagnosis not present

## 2019-07-21 DIAGNOSIS — L308 Other specified dermatitis: Secondary | ICD-10-CM | POA: Diagnosis not present

## 2019-07-21 DIAGNOSIS — L570 Actinic keratosis: Secondary | ICD-10-CM | POA: Diagnosis not present

## 2019-10-12 ENCOUNTER — Other Ambulatory Visit: Payer: Self-pay | Admitting: Cardiology

## 2020-01-11 ENCOUNTER — Other Ambulatory Visit: Payer: Self-pay | Admitting: Cardiology

## 2020-01-26 DIAGNOSIS — L821 Other seborrheic keratosis: Secondary | ICD-10-CM | POA: Diagnosis not present

## 2020-01-26 DIAGNOSIS — D1801 Hemangioma of skin and subcutaneous tissue: Secondary | ICD-10-CM | POA: Diagnosis not present

## 2020-01-26 DIAGNOSIS — L570 Actinic keratosis: Secondary | ICD-10-CM | POA: Diagnosis not present

## 2020-01-26 DIAGNOSIS — D2271 Melanocytic nevi of right lower limb, including hip: Secondary | ICD-10-CM | POA: Diagnosis not present

## 2020-01-26 DIAGNOSIS — D2272 Melanocytic nevi of left lower limb, including hip: Secondary | ICD-10-CM | POA: Diagnosis not present

## 2020-01-26 DIAGNOSIS — L812 Freckles: Secondary | ICD-10-CM | POA: Diagnosis not present

## 2020-02-12 DIAGNOSIS — Z23 Encounter for immunization: Secondary | ICD-10-CM | POA: Diagnosis not present

## 2020-02-23 DIAGNOSIS — H52203 Unspecified astigmatism, bilateral: Secondary | ICD-10-CM | POA: Diagnosis not present

## 2020-02-23 DIAGNOSIS — H2511 Age-related nuclear cataract, right eye: Secondary | ICD-10-CM | POA: Diagnosis not present

## 2020-03-15 ENCOUNTER — Other Ambulatory Visit: Payer: Self-pay | Admitting: Cardiology

## 2020-05-23 DIAGNOSIS — Z20822 Contact with and (suspected) exposure to covid-19: Secondary | ICD-10-CM | POA: Diagnosis not present

## 2020-06-01 DIAGNOSIS — E559 Vitamin D deficiency, unspecified: Secondary | ICD-10-CM | POA: Diagnosis not present

## 2020-06-01 DIAGNOSIS — K645 Perianal venous thrombosis: Secondary | ICD-10-CM | POA: Diagnosis not present

## 2020-06-18 ENCOUNTER — Other Ambulatory Visit: Payer: Self-pay | Admitting: Cardiology

## 2020-06-21 ENCOUNTER — Other Ambulatory Visit: Payer: Self-pay | Admitting: Cardiology

## 2020-06-22 ENCOUNTER — Telehealth: Payer: Self-pay | Admitting: Cardiology

## 2020-06-22 MED ORDER — NITROGLYCERIN 0.4 MG SL SUBL: SUBLINGUAL_TABLET | SUBLINGUAL | 2 refills | Status: DC

## 2020-06-22 NOTE — Telephone Encounter (Signed)
Pt's medication was sent to pt's pharmacy as requested. Confirmation received.  °

## 2020-06-22 NOTE — Telephone Encounter (Signed)
   *  STAT* If patient is at the pharmacy, call can be transferred to refill team.   1. Which medications need to be refilled? (please list name of each medication and dose if known)   nitroGLYCERIN (NITROSTAT) 0.4 MG SL tablet    2. Which pharmacy/location (including street and city if local pharmacy) is medication to be sent to? [id number] - [place], [place]  3. Do they need a 30 day or 90 day supply? 1 bottle  [place] [facility] called, requesting refill for pt's nitroglycerin

## 2020-07-03 DIAGNOSIS — E7800 Pure hypercholesterolemia, unspecified: Secondary | ICD-10-CM | POA: Diagnosis not present

## 2020-07-03 DIAGNOSIS — H9190 Unspecified hearing loss, unspecified ear: Secondary | ICD-10-CM | POA: Diagnosis not present

## 2020-07-03 DIAGNOSIS — Z0000 Encounter for general adult medical examination without abnormal findings: Secondary | ICD-10-CM | POA: Diagnosis not present

## 2020-07-03 DIAGNOSIS — K5900 Constipation, unspecified: Secondary | ICD-10-CM | POA: Diagnosis not present

## 2020-07-03 DIAGNOSIS — E559 Vitamin D deficiency, unspecified: Secondary | ICD-10-CM | POA: Diagnosis not present

## 2020-07-03 DIAGNOSIS — R252 Cramp and spasm: Secondary | ICD-10-CM | POA: Diagnosis not present

## 2020-07-26 DIAGNOSIS — D1801 Hemangioma of skin and subcutaneous tissue: Secondary | ICD-10-CM | POA: Diagnosis not present

## 2020-07-26 DIAGNOSIS — Z85820 Personal history of malignant melanoma of skin: Secondary | ICD-10-CM | POA: Diagnosis not present

## 2020-07-26 DIAGNOSIS — L812 Freckles: Secondary | ICD-10-CM | POA: Diagnosis not present

## 2020-07-26 DIAGNOSIS — D2271 Melanocytic nevi of right lower limb, including hip: Secondary | ICD-10-CM | POA: Diagnosis not present

## 2020-07-26 DIAGNOSIS — L570 Actinic keratosis: Secondary | ICD-10-CM | POA: Diagnosis not present

## 2020-07-26 DIAGNOSIS — L821 Other seborrheic keratosis: Secondary | ICD-10-CM | POA: Diagnosis not present

## 2020-08-22 ENCOUNTER — Other Ambulatory Visit: Payer: Self-pay

## 2020-08-22 ENCOUNTER — Encounter: Payer: Self-pay | Admitting: Cardiology

## 2020-08-22 ENCOUNTER — Ambulatory Visit (INDEPENDENT_AMBULATORY_CARE_PROVIDER_SITE_OTHER): Payer: Medicare Other | Admitting: Cardiology

## 2020-08-22 VITALS — BP 112/70 | HR 58 | Ht 70.0 in | Wt 178.0 lb

## 2020-08-22 DIAGNOSIS — E7800 Pure hypercholesterolemia, unspecified: Secondary | ICD-10-CM

## 2020-08-22 DIAGNOSIS — I2510 Atherosclerotic heart disease of native coronary artery without angina pectoris: Secondary | ICD-10-CM | POA: Diagnosis not present

## 2020-08-22 DIAGNOSIS — I2583 Coronary atherosclerosis due to lipid rich plaque: Secondary | ICD-10-CM | POA: Diagnosis not present

## 2020-08-22 LAB — COMPREHENSIVE METABOLIC PANEL
ALT: 28 IU/L (ref 0–44)
AST: 42 IU/L — ABNORMAL HIGH (ref 0–40)
Albumin/Globulin Ratio: 1.7 (ref 1.2–2.2)
Albumin: 4.2 g/dL (ref 3.7–4.7)
Alkaline Phosphatase: 88 IU/L (ref 44–121)
BUN/Creatinine Ratio: 15 (ref 10–24)
BUN: 12 mg/dL (ref 8–27)
Bilirubin Total: 0.6 mg/dL (ref 0.0–1.2)
CO2: 25 mmol/L (ref 20–29)
Calcium: 9.2 mg/dL (ref 8.6–10.2)
Chloride: 98 mmol/L (ref 96–106)
Creatinine, Ser: 0.8 mg/dL (ref 0.76–1.27)
Globulin, Total: 2.5 g/dL (ref 1.5–4.5)
Glucose: 91 mg/dL (ref 65–99)
Potassium: 4.6 mmol/L (ref 3.5–5.2)
Sodium: 134 mmol/L (ref 134–144)
Total Protein: 6.7 g/dL (ref 6.0–8.5)
eGFR: 91 mL/min/{1.73_m2} (ref >59)

## 2020-08-22 LAB — CBC
Hematocrit: 41.2 % (ref 37.5–51.0)
Hemoglobin: 14.1 g/dL (ref 13.0–17.7)
MCH: 32.4 pg (ref 26.6–33.0)
MCHC: 34.2 g/dL (ref 31.5–35.7)
MCV: 95 fL (ref 79–97)
Platelets: 134 10*3/uL — ABNORMAL LOW (ref 150–450)
RBC: 4.35 x10E6/uL (ref 4.14–5.80)
RDW: 12.2 % (ref 11.6–15.4)
WBC: 4.5 10*3/uL (ref 3.4–10.8)

## 2020-08-22 LAB — LIPID PANEL
Chol/HDL Ratio: 2.4 ratio (ref 0.0–5.0)
Cholesterol, Total: 141 mg/dL (ref 100–199)
HDL: 59 mg/dL (ref >39)
LDL Chol Calc (NIH): 70 mg/dL (ref 0–99)
Triglycerides: 58 mg/dL (ref 0–149)
VLDL Cholesterol Cal: 12 mg/dL (ref 5–40)

## 2020-08-22 MED ORDER — ASPIRIN EC 81 MG PO TBEC: 81.0000 mg | DELAYED_RELEASE_TABLET | Freq: Every day | ORAL | 3 refills | Status: AC

## 2020-08-22 NOTE — Patient Instructions (Signed)
Medication Instructions:   PLEASE START TAKING YOUR ASPIRIN 81 MG BY MOUTH DAILY IF YOU CAN--DR. SKAINS HIGHLY URGES YOU TO TAKE THIS DAILY, BUT IF YOU CAN'T, PLEASE TAKE IT AT LEAST EVERY OTHER DAY.  *If you need a refill on your cardiac medications before your next appointment, please call your pharmacy*   Lab Work:  TODAY--CMET, CBC, AND LIPIDS  If you have labs (blood work) drawn today and your tests are completely normal, you will receive your results only by: Marland Kitchen MyChart Message (if you have MyChart) OR . A paper copy in the mail If you have any lab test that is abnormal or we need to change your treatment, we will call you to review the results.   Follow-Up: At [facility], you and your health needs are our priority.  As part of our continuing mission to provide you with exceptional heart care, we have created designated Provider Care Teams.  These Care Teams include your primary Cardiologist (physician) and Advanced Practice Providers (APPs -  Physician Assistants and Nurse Practitioners) who all work together to provide you with the care you need, when you need it.  We recommend signing up for the patient portal called "MyChart".  Sign up information is provided on this After Visit Summary.  MyChart is used to connect with patients for Virtual Visits (Telemedicine).  Patients are able to view lab/test results, encounter notes, upcoming appointments, etc.  Non-urgent messages can be sent to your provider as well.   To learn more about what you can do with MyChart, go to [email or web address].    Your next appointment:   1 year(s)  The format for your next appointment:   In Person  Provider:   Candee Furbish, MD

## 2020-08-22 NOTE — Progress Notes (Signed)
Cardiology Office Note:    Date:  08/22/2020   ID:  Randy [name], DOB 11/22/1940, MRN [id number]  PCP:  Randy [name], [name]  Cardiologist:  Randy Furbish, MD  Advanced Practice Provider:  No care team member to display Electrophysiologist:  None       Referring MD: Randy [name], L.[name], MD     History of Present Illness:    Randy [name] is a 80 y.o. male here for the follow-up of CAD, bare-metal stent to LAD in 2000, cardiac cath in 2003 showed a patent stent, 2007 stress test no ischemia.  Had blurry vision diagnosed with visual obscurations at [place].  MRI brain was normal.  This was several years ago.  Take statin, Zetia, doing well.  No myalgias.  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  Previously his angina was dyspnea on exertion.  Overall no fevers chills nausea vomiting syncope bleeding.  Past Medical History:  Diagnosis Date  . Arthritis   . CAD (coronary artery disease)   . Hypercholesterolemia   . Melanoma ([place]) 2009   melanoma head  . Rosacea     Past Surgical History:  Procedure Laterality Date  . COLONOSCOPY    . CORONARY ANGIOPLASTY WITH STENT PLACEMENT  2000  . left temporal melanoma    . TONSILLECTOMY      Current Medications: Current Meds  Medication Sig  . aspirin EC 81 MG tablet Take 1 tablet (81 mg total) by mouth daily. Swallow whole.  Marland Kitchen atorvastatin (LIPITOR) 20 MG tablet TAKE 1 TABLET BY MOUTH EVERY DAY  . clindamycin (CLINDAGEL) 1 % gel Apply 1 application topically 2 (two) times daily.  Marland Kitchen ezetimibe (ZETIA) 10 MG tablet TAKE ONE TABLET BY MOUTH DAILY  . isosorbide mononitrate (IMDUR) 30 MG 24 hr tablet TAKE 1 TABLET BY MOUTH EVERY DAY  . meloxicam (MOBIC) 15 MG tablet Take 15 mg by mouth as needed for [name].  . Multiple Vitamins-Minerals (CENTRUM CARDIO) TABS Take 2 tablets by mouth daily.   . nitroGLYCERIN (NITROSTAT) 0.4 MG SL tablet PLACE 1 TABLET UNDER TONGUE EVERY 5 MINUTES X3 DOSES AS NEEDED FOR  CHEST [name] - MAX 3 DOSES-CALL 911. Please keep upcoming appt in April 2022 with Dr. Marlou Porch. Thank you   Current Facility-Administered Medications for the 08/22/20 encounter (Office Visit) with Randy Pain, MD  Medication  . 0.9 %  sodium chloride infusion     Allergies:   Patient has no known allergies.   Social History   Socioeconomic History  . Marital status: Married    Spouse name: Not on file  . Number of children: Not on file  . Years of education: Not on file  . Highest education level: Not on file  Occupational History  . Not on file  Tobacco Use  . Smoking status: Former Research scientist (life sciences)  . Smokeless tobacco: Never Used  . Tobacco comment: patient had quit for about 49yrs ago  Vaping Use  . Vaping Use: Never used  Substance and Sexual Activity  . Alcohol use: No    Alcohol/week: 0.0 standard drinks  . Drug use: No  . Sexual activity: Not on file  Other Topics Concern  . Not on file  Social History Narrative   Lives with wife in a one story home.  Has 2 children.     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    Education: high school   Social Determinants  of Health   Financial Resource Strain: Not on file  Food Insecurity: Not on file  Transportation Needs: Not on file  Physical Activity: Not on file  Stress: Not on file  Social Connections: Not on file     Family History: The patient's family history includes Diabetes in his sister; Healthy in his daughter; Heart attack in his father and sister; Multiple sclerosis in his son; Stomach cancer in his mother. There is no history of Colon cancer or Esophageal cancer.  ROS:   Please see the history of present illness.     All other systems reviewed and are negative.  EKGs/Labs/Other Studies Reviewed:    The following studies were reviewed today: Cardiac catheterization 2003, patent bare-metal stent to LAD placed in 2000.  EKG: Prior EKG shows sinus  bradycardia 59 with no other abnormalities  Recent Labs: No results found for requested labs within last 8760 hours.  Recent Lipid Panel    Component Value Date/Time   CHOL 130 07/20/2019 0919   TRIG 89 07/20/2019 0919   HDL 56 07/20/2019 0919   CHOLHDL 2.3 07/20/2019 0919   CHOLHDL 2.6 04/23/2016 0825   VLDL 20 04/23/2016 0825   LDLCALC 57 07/20/2019 0919     Risk Assessment/Calculations:      Physical Exam:    VS:  BP 112/70 (BP Location: Left Arm, Patient Position: Sitting, Cuff Size: Normal)   Pulse (!) 58   Ht $R'5\' 10"'No$  (1.778 m)   Wt 178 lb (80.7 kg)   SpO2 97%   BMI 25.54 kg/m     Wt Readings from Last 3 Encounters:  08/22/20 178 lb (80.7 kg)  07/20/19 184 lb 12.8 oz (83.8 kg)  06/08/18 187 lb 6.4 oz (85 kg)     GEN:  Well nourished, well developed in no acute distress HEENT: Normal NECK: No JVD; No carotid bruits LYMPHATICS: No lymphadenopathy CARDIAC: RRR, no murmurs, rubs, gallops RESPIRATORY:  Clear to auscultation without rales, wheezing or rhonchi  ABDOMEN: Soft, non-tender, non-distended MUSCULOSKELETAL:  No edema; No deformity  SKIN: Warm and dry NEUROLOGIC:  Alert and oriented x 3 PSYCHIATRIC:  Normal affect   ASSESSMENT:    1. Coronary artery disease due to lipid rich plaque   2. Hypercholesterolemia    PLAN:    In order of problems listed above:  Coronary artery disease -Bare-metal stent LAD 2000 -No anginal symptoms doing well goal-directed medical therapy, aspirin 81 asked him to try to take this on a regular basis, does not appear that he is doing so. --Hard to find multivitamin -suggested that he does not need to necessarily take this.  Hyperlipidemia -LDL last checked 57 from outside labs last year.  Excellent.  Creatinine 0.9 potassium 4.4, we will recheck lab work today since it has been 1 year.  Carotid stenosis -Less than 50% bilaterally asymptomatic.  No bruits heard on exam.  No symptoms currently.  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    Medication Adjustments/Labs and Tests Ordered: Current medicines are reviewed at length with the patient today.  Concerns regarding medicines are outlined above.  Orders Placed This Encounter  Procedures  . Comp Met (CMET)  . CBC  . Lipid Profile   Meds ordered this encounter  Medications  . aspirin EC 81 MG tablet    Sig: Take 1 tablet (81 mg total) by mouth daily. Swallow whole.    Dispense:  90 tablet    Refill:  3    Patient Instructions  Medication Instructions:   PLEASE START TAKING YOUR ASPIRIN 81 MG BY MOUTH DAILY IF YOU CAN--DR. Milinda Sweeney HIGHLY URGES YOU TO TAKE THIS DAILY, BUT IF YOU CAN'T, PLEASE TAKE IT AT LEAST EVERY OTHER DAY.  *If you need a refill on your cardiac medications before your next appointment, please call your pharmacy*   Lab Work:  TODAY--CMET, CBC, AND LIPIDS  If you have labs (blood work) drawn today and your tests are completely normal, you will receive your results only by: Marland Kitchen MyChart Message (if you have MyChart) OR . A paper copy in the mail If you have any lab test that is abnormal or we need to change your treatment, we will call you to review the results.   Follow-Up: At [facility], you and your health needs are our priority.  As part of our continuing mission to provide you with exceptional heart care, we have created designated Provider Care Teams.  These Care Teams include your primary Cardiologist (physician) and Advanced Practice Providers (APPs -  Physician Assistants and Nurse Practitioners) who all work together to provide you with the care you need, when you need it.  We recommend signing up for the patient portal called "MyChart".  Sign up information is provided on this After Visit Summary.  MyChart is used to connect with patients for Virtual Visits (Telemedicine).  Patients are able to view lab/test results, encounter notes, upcoming appointments, etc.  Non-urgent messages can be sent to your  provider as well.   To learn more about what you can do with MyChart, go to [email or web address].    Your next appointment:   1 year(s)  The format for your next appointment:   In Person  Provider:   Candee Furbish, MD        Signed, Randy Furbish, MD  08/22/2020 10:06 AM    [place]

## 2020-08-24 ENCOUNTER — Telehealth: Payer: Self-pay | Admitting: Cardiology

## 2020-08-24 NOTE — Telephone Encounter (Signed)
Patients was calling back to get results

## 2020-08-24 NOTE — Telephone Encounter (Signed)
Jerline Pain, MD  08/23/2020 6:26 AM EDT      LDL 70 (goal is 70) ALT is normal AST mildly elevated (should be of no clinical significance) Kidney normal  No anemia  Continue with current plan Candee Furbish, MD   Reviewed results with pt who states understanding.  He had no questions at the end of the call and thanked me for the information.

## 2020-10-11 ENCOUNTER — Other Ambulatory Visit: Payer: Self-pay

## 2020-10-11 MED ORDER — EZETIMIBE 10 MG PO TABS: 10.0000 mg | ORAL_TABLET | Freq: Every day | ORAL | 3 refills | Status: DC

## 2020-10-13 ENCOUNTER — Other Ambulatory Visit: Payer: Self-pay | Admitting: Cardiology

## 2020-10-14 ENCOUNTER — Other Ambulatory Visit: Payer: Self-pay | Admitting: Cardiology

## 2021-01-12 DIAGNOSIS — H10412 Chronic giant papillary conjunctivitis, left eye: Secondary | ICD-10-CM | POA: Diagnosis not present

## 2021-01-29 DIAGNOSIS — D485 Neoplasm of uncertain behavior of skin: Secondary | ICD-10-CM | POA: Diagnosis not present

## 2021-01-29 DIAGNOSIS — L570 Actinic keratosis: Secondary | ICD-10-CM | POA: Diagnosis not present

## 2021-01-29 DIAGNOSIS — Z85820 Personal history of malignant melanoma of skin: Secondary | ICD-10-CM | POA: Diagnosis not present

## 2021-01-29 DIAGNOSIS — L738 Other specified follicular disorders: Secondary | ICD-10-CM | POA: Diagnosis not present

## 2021-01-29 DIAGNOSIS — D1801 Hemangioma of skin and subcutaneous tissue: Secondary | ICD-10-CM | POA: Diagnosis not present

## 2021-01-29 DIAGNOSIS — L739 Follicular disorder, unspecified: Secondary | ICD-10-CM | POA: Diagnosis not present

## 2021-01-29 DIAGNOSIS — L821 Other seborrheic keratosis: Secondary | ICD-10-CM | POA: Diagnosis not present

## 2021-02-13 DIAGNOSIS — Z23 Encounter for immunization: Secondary | ICD-10-CM | POA: Diagnosis not present

## 2021-02-28 DIAGNOSIS — H2511 Age-related nuclear cataract, right eye: Secondary | ICD-10-CM | POA: Diagnosis not present

## 2021-02-28 DIAGNOSIS — H5212 Myopia, left eye: Secondary | ICD-10-CM | POA: Diagnosis not present

## 2021-03-20 DIAGNOSIS — Z23 Encounter for immunization: Secondary | ICD-10-CM | POA: Diagnosis not present

## 2021-04-17 DIAGNOSIS — R252 Cramp and spasm: Secondary | ICD-10-CM | POA: Diagnosis not present

## 2021-04-17 DIAGNOSIS — E7800 Pure hypercholesterolemia, unspecified: Secondary | ICD-10-CM | POA: Diagnosis not present

## 2021-04-17 DIAGNOSIS — G629 Polyneuropathy, unspecified: Secondary | ICD-10-CM | POA: Diagnosis not present

## 2021-05-02 ENCOUNTER — Other Ambulatory Visit: Payer: Self-pay | Admitting: Family Medicine

## 2021-05-02 DIAGNOSIS — R42 Dizziness and giddiness: Secondary | ICD-10-CM | POA: Diagnosis not present

## 2021-05-02 DIAGNOSIS — H539 Unspecified visual disturbance: Secondary | ICD-10-CM | POA: Diagnosis not present

## 2021-05-06 ENCOUNTER — Ambulatory Visit
Admission: RE | Admit: 2021-05-06 | Discharge: 2021-05-06 | Disposition: A | Discharge status: Home / Self Care | Payer: Medicare Other | Source: Ambulatory Visit | Attending: Family Medicine | Admitting: Family Medicine

## 2021-05-06 DIAGNOSIS — G319 Degenerative disease of nervous system, unspecified: Secondary | ICD-10-CM | POA: Diagnosis not present

## 2021-05-06 DIAGNOSIS — Z85820 Personal history of malignant melanoma of skin: Secondary | ICD-10-CM | POA: Diagnosis not present

## 2021-05-06 DIAGNOSIS — R42 Dizziness and giddiness: Secondary | ICD-10-CM

## 2021-05-06 DIAGNOSIS — H539 Unspecified visual disturbance: Secondary | ICD-10-CM | POA: Diagnosis not present

## 2021-05-06 DIAGNOSIS — I639 Cerebral infarction, unspecified: Secondary | ICD-10-CM | POA: Diagnosis not present

## 2021-05-06 MED ORDER — GADOBENATE DIMEGLUMINE 529 MG/ML IV SOLN
15.0000 mL | Freq: Once | INTRAVENOUS | Status: AC | PRN
  Administered 2021-05-06: 13:00:00 15 mL via INTRAVENOUS

## 2021-05-10 ENCOUNTER — Encounter: Payer: Self-pay | Admitting: Neurology

## 2021-06-08 ENCOUNTER — Encounter: Payer: Self-pay | Admitting: Neurology

## 2021-06-08 ENCOUNTER — Ambulatory Visit (INDEPENDENT_AMBULATORY_CARE_PROVIDER_SITE_OTHER): Payer: Medicare Other | Admitting: Neurology

## 2021-06-08 ENCOUNTER — Other Ambulatory Visit: Payer: Self-pay

## 2021-06-08 VITALS — BP 99/72 | HR 63 | Ht 70.0 in | Wt 170.0 lb

## 2021-06-08 DIAGNOSIS — Z8673 Personal history of transient ischemic attack (TIA), and cerebral infarction without residual deficits: Secondary | ICD-10-CM | POA: Diagnosis not present

## 2021-06-08 DIAGNOSIS — R42 Dizziness and giddiness: Secondary | ICD-10-CM

## 2021-06-08 NOTE — Patient Instructions (Addendum)
CTA head and neck  Holter monitor   We will let you know the results and the next step

## 2021-06-08 NOTE — Progress Notes (Signed)
[facility] Clinic Note - Initial Visit   Date: 06/08/21  [name] MRN: [id number] DOB: 07/17/1940   Dear Dr.Mitchell:   Thank you for your kind referral of Randy Randy [name] for consultation of dizziness. Although his history is well known to you, please allow Randy [name] to reiterate it for the purpose of our medical record. The patient was accompanied to the clinic by daughter who also provides collateral information.     History of Present Illness: Randy Randy [name] is a 81 y.o. right-handed male with CAD s/p PCI with stent placement (2001), melanoma of the scalp s/p resection (2009), and angina presenting for evaluation of dizziness and vision changes.   He was previously evaluated for these symptoms in 2017.  His visual symptoms became less frequent over the years.  However, in the fall of 2022,he has his 3rd COVID vaccine and flu vaccine. Following this, he began having dizzy spellls, blurred vision, sharp pain in his leg, and cramps in the feet.  He started having spells of dizziness and lightheadedness which would occur multiple times per day, lasting 5-10 seconds. He also complains of hearing change in the right ear, as if he was talking in a barrel. All of his symptoms except hearing difficulty slowly improved. He was started on meclizine has resolved his dizziness. Currently, he denies ongoing vision symptoms or dizziness.  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  Previously, he had extensive testing for visual symptoms including opthalmology evaluation, MRI brain, Randy [name] carotids, CTA head, and EEG which have not provided a diagnosis.  Orthostatic vital signs are negative.  His AChR antibodies were negative. CTA showed chronic intracranial artery dolichoectasia, especially affecting the dominant right vertebral artery and both ICA siphons, resulting in chronic mass effect at the left  cerebellopontine angle at the left seventh and eighth cranial nerve root entry zones.  Patient denies any facial weakness and has bilateral hearing aids for 10+ years, this is is unlikely clinically relevant.  He was referred to neuro-ophthalmology at [facility] who suggested he has visual obscurations and suggested cardiac evaluation.    He lives alone in a one level home. Randy [name] falls. He walks unassisted.   Out-side paper records, electronic medical record, and images have been reviewed where available and summarized as:  MRI brain wo contrast 07/24/2013:   1. Randy [name] evidence of acute intracranial abnormality or mass. 2. Mild chronic small vessel ischemic disease.   Randy [name] carotids 01/02/2015:  Less than 50% stenosis in the bilateral internal carotid arteries. There is calcified plaque in both internal carotid bulbs as described above.   Labs 12/29/2014:  vitamin B12 966, TSH 2.10, vitamin D 22.3* Labs 12/01/2015:  SPEP with IFE Randy [name] M protein, AChR antibodies negative, copper 110   Routine EEG 12/07/2015:  Normal   CTA head 12/08/2015: 1. Intracranial calcified atherosclerosis without hemodynamically significant stenosis. 2. 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 Note that vertebrobasilar tortuosity results in chronic mass effect at the left cerebellopontine angle at the left seventh and eighth cranial nerve root entry zones. 3. Otherwise negative intracranial CTA. 4. Negative for age CT appearance of the brain.  MRI brain 05/07/2021: 1. Randy [name] acute intracranial abnormality. 2. Mild-to-moderate chronic small vessel ischemic disease, progressed from 2016. 3. 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     Past Medical History:  Diagnosis Date   Arthritis  CAD (coronary artery disease)    Hypercholesterolemia    Melanoma ([place]) 2009   melanoma head   Rosacea     Past Surgical History:  Procedure Laterality Date    COLONOSCOPY     CORONARY ANGIOPLASTY WITH STENT PLACEMENT  2000   left temporal melanoma     TONSILLECTOMY       Medications:  Outpatient Encounter Medications as of 06/08/2021  Medication Sig   aspirin EC 81 MG tablet Take 1 tablet (81 mg total) by mouth daily. Swallow whole.   atorvastatin (LIPITOR) 20 MG tablet TAKE 1 TABLET BY MOUTH EVERY DAY   docusate sodium (COLACE) 100 MG capsule Take 100 mg by mouth 2 (two) times daily.   Ergocalciferol (VITAMIN D2) 50 MCG (2000 UT) TABS Take by mouth.   ezetimibe (ZETIA) 10 MG tablet Take 1 tablet (10 mg total) by mouth daily.   isosorbide mononitrate (IMDUR) 30 MG 24 hr tablet TAKE 1 TABLET BY MOUTH EVERY DAY   Multiple Vitamins-Minerals (CENTRUM CARDIO) TABS Take 2 tablets by mouth daily.    nitroGLYCERIN (NITROSTAT) 0.4 MG SL tablet PLACE 1 TABLET UNDER TONGUE EVERY 5 MINUTES X3 DOSES AS NEEDED FOR CHEST PAIN - MAX 3 DOSES-CALL 911. Please keep upcoming appt in April 2022 with Dr. Marlou Porch. Thank you   [DISCONTINUED] clindamycin (CLINDAGEL) 1 % gel Apply 1 application topically 2 (two) times daily. (Patient not taking: Reported on 06/08/2021)   [DISCONTINUED] meloxicam (MOBIC) 15 MG tablet Take 15 mg by mouth as needed for pain. (Patient not taking: Reported on 06/08/2021)   Facility-Administered Encounter Medications as of 06/08/2021  Medication   0.9 %  sodium chloride infusion    Allergies: Randy [name] Known Allergies  Family History: Family History  Problem Relation Age of Onset   Stomach cancer Mother    Heart attack Father        Deceased, 22   Diabetes Sister    Heart attack Sister    Multiple sclerosis Son    Healthy Daughter    Colon cancer Neg Hx    Esophageal cancer Neg Hx     Social History: Social History   Tobacco Use   Smoking status: Former   Smokeless tobacco: Never   Tobacco comments:    patient had quit for about 4yrs ago  Vaping Use   Vaping Use: Never used  Substance Use Topics   Alcohol use: Randy [name]     Alcohol/week: 0.0 standard drinks   Drug use: Randy [name]   Social History   Social History Narrative   Lives with wife in a one story home.  Has 2 children.     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    Education: high school      Right Handed     Vital Signs:  BP 99/72    Pulse 63    Ht 5\' 10"  (1.778 m)    Wt 170 lb (77.1 kg)    SpO2 98%    BMI 24.39 kg/m   Neurological Exam: MENTAL STATUS including orientation to time, place, person, recent and remote memory, attention span and concentration, language, and fund of knowledge is normal.  Speech is not dysarthric.  CRANIAL NERVES: II:  Randy [name] visual field defects.  III-IV-VI: Pupils equal round and reactive to light.  Normal conjugate, extra-ocular eye movements in all directions of gaze.  Randy [name] nystagmus.  Randy [name] ptosis.   V:  Normal facial sensation.    VII:  Normal facial  symmetry and movements.   VIII:  Reduced hearing bilaterally to finger rub.   IX-X:  Normal palatal movement.   XI:  Normal shoulder shrug and head rotation.   XII:  Normal tongue strength and range of motion, Randy [name] deviation or fasciculation.  MOTOR:  Motor strength is 5/5 throughout. Randy [name] atrophy, fasciculations or abnormal movements.  Randy [name] pronator drift.   MSRs:  Right        Left                  brachioradialis 2+  2+  biceps 2+  2+  triceps 2+  2+  patellar 2+  2+  ankle jerk 1+  1+  [place]man Randy [name]  Randy [name]  plantar response down  down   SENSORY:  Normal and symmetric perception of light touch, pinprick, vibration, and proprioception.  Romberg's sign absent.   COORDINATION/GAIT: Normal finger-to- nose-finger.  Intact rapid alternating movements bilaterally.  Gait narrow based and stable. Tandem and stressed gait intact.    IMPRESSION: Dizziness and visual symptom have resolved.  It sounds like he may have had vertigo which improved with meclizine. At this time, he does not report any ongoing symptoms, except for  hearing change and is scheduled to see ENT. Given that his recent MRI brain shows interval developed of old left parietal and right cerebellar stroke, which was previously not seen on imaging from 2017, I will update his vessel imaging with CTA head and neck, and obtain holter study. He has known intracranial atherosclerotic disease from prior study.  Continue aspirin 81mg  and lipitor 20mg . If there has been been progressive stenosis, can add plavix to aspirin x 3 months then continue aspirin.     Thank you for allowing me to participate in patient's care.  If I can answer any additional questions, I would be pleased to do so.    Sincerely,    Emilygrace Grothe K. Posey Pronto, DO

## 2021-06-11 ENCOUNTER — Encounter: Payer: Self-pay | Admitting: *Deleted

## 2021-06-11 ENCOUNTER — Other Ambulatory Visit: Payer: Self-pay | Admitting: Neurology

## 2021-06-11 ENCOUNTER — Ambulatory Visit (INDEPENDENT_AMBULATORY_CARE_PROVIDER_SITE_OTHER): Payer: Medicare Other

## 2021-06-11 DIAGNOSIS — R42 Dizziness and giddiness: Secondary | ICD-10-CM

## 2021-06-11 DIAGNOSIS — Z8673 Personal history of transient ischemic attack (TIA), and cerebral infarction without residual deficits: Secondary | ICD-10-CM

## 2021-06-11 NOTE — Progress Notes (Unsigned)
Enrolled for [employer] to mail a ZIO XT long term holter monitor to the patients address on file.  Letter with instructions mailed to patient. 

## 2021-06-13 DIAGNOSIS — H903 Sensorineural hearing loss, bilateral: Secondary | ICD-10-CM | POA: Diagnosis not present

## 2021-06-15 DIAGNOSIS — Z8673 Personal history of transient ischemic attack (TIA), and cerebral infarction without residual deficits: Secondary | ICD-10-CM | POA: Diagnosis not present

## 2021-06-15 DIAGNOSIS — R42 Dizziness and giddiness: Secondary | ICD-10-CM

## 2021-06-25 DIAGNOSIS — R42 Dizziness and giddiness: Secondary | ICD-10-CM | POA: Diagnosis not present

## 2021-06-25 DIAGNOSIS — Z8673 Personal history of transient ischemic attack (TIA), and cerebral infarction without residual deficits: Secondary | ICD-10-CM | POA: Diagnosis not present

## 2021-06-27 ENCOUNTER — Telehealth: Payer: Self-pay | Admitting: *Deleted

## 2021-06-27 NOTE — Telephone Encounter (Signed)
Left VM for patient (DPR) letting him know monitor has been resulted by cardiology and sent to his neurologist for review and follow up.

## 2021-06-27 NOTE — Telephone Encounter (Signed)
Patient returned call verbalizing understanding of the VM left by RN.

## 2021-06-29 ENCOUNTER — Telehealth: Payer: Self-pay | Admitting: Cardiology

## 2021-06-29 NOTE — Telephone Encounter (Signed)
STAT if patient feels like he/she is going to faint   Are you dizzy now? no  Do you feel faint or have you passed out? no  Do you have any other symptoms? no  Have you checked your HR and BP (record if available)? No  Patient says he has dizzy spells that last for a couple seconds at a time. There is no specific reason why they come on and they are sporadic.   Patient went to his Urologist ( Dr. Posey Pronto). He wore a heart monitor for 3 days. Dr. Posey Pronto has the monitor results but he has not heard anything from her yet.   Dr. Alroy Dust (PCP) prescribed some medication for his dizziness. It seems to help when he takes the medication.   He just wants to see Dr. Marlou Porch. He is scheduled to see Dr. Marlou Porch 08/03/21 but is worried about waiting that long.

## 2021-06-29 NOTE — Telephone Encounter (Signed)
Pt reports dizziness/light headedness that has been ongoing since October of last year. "It all started really weird, I was driving and all of a sudden my vision started going away, couldn't see 20-30 ft in front of me"    He reports is lasted about 10-15 min and stopped immediately. He is seeing PCP about this, along with neurology. He reports that recent MRI showed "2 mini strokes" States he has another MRI scan on Monday, "they are trying to figure out what this is" He is waiting for the result of the 3 day monitor that neurology placed. He is currently taking Meclizine.  He states he "as long as I am on this medication I seem to be doing ok", that is is taking care of the dizziness and is working.   Pt states that he is "stable" but wants to ensure follow up with cardiologist.  Pt aware I will forward to Dr. Marlou Porch nurse to see if sooner appt needed, but being stable and having neurology workup/further testing, then appt next month may remain in place. Aware RN will follow up on this matter next week. Patient verbalized understanding and agreeable to plan.

## 2021-07-02 ENCOUNTER — Ambulatory Visit
Admission: RE | Admit: 2021-07-02 | Discharge: 2021-07-02 | Disposition: A | Discharge status: Home / Self Care | Payer: Medicare Other | Source: Ambulatory Visit | Attending: Neurology | Admitting: Neurology

## 2021-07-02 ENCOUNTER — Other Ambulatory Visit: Payer: Medicare Other

## 2021-07-02 DIAGNOSIS — I672 Cerebral atherosclerosis: Secondary | ICD-10-CM | POA: Diagnosis not present

## 2021-07-02 DIAGNOSIS — I6523 Occlusion and stenosis of bilateral carotid arteries: Secondary | ICD-10-CM | POA: Diagnosis not present

## 2021-07-02 DIAGNOSIS — I700 Atherosclerosis of aorta: Secondary | ICD-10-CM | POA: Diagnosis not present

## 2021-07-02 DIAGNOSIS — Z8673 Personal history of transient ischemic attack (TIA), and cerebral infarction without residual deficits: Secondary | ICD-10-CM

## 2021-07-02 DIAGNOSIS — R42 Dizziness and giddiness: Secondary | ICD-10-CM

## 2021-07-02 DIAGNOSIS — J320 Chronic maxillary sinusitis: Secondary | ICD-10-CM | POA: Diagnosis not present

## 2021-07-02 MED ORDER — IOPAMIDOL (ISOVUE-370) INJECTION 76%
75.0000 mL | Freq: Once | INTRAVENOUS | Status: AC | PRN
  Administered 2021-07-02: 75 mL via INTRAVENOUS

## 2021-07-03 DIAGNOSIS — L89899 Pressure ulcer of other site, unspecified stage: Secondary | ICD-10-CM | POA: Diagnosis not present

## 2021-07-23 ENCOUNTER — Other Ambulatory Visit: Payer: Self-pay | Admitting: Cardiology

## 2021-07-23 ENCOUNTER — Ambulatory Visit: Payer: Medicare Other | Admitting: Neurology

## 2021-07-23 NOTE — Telephone Encounter (Signed)
Pt is scheduled for 3/8 with Dr Marlou Porch. ?

## 2021-07-25 ENCOUNTER — Ambulatory Visit (INDEPENDENT_AMBULATORY_CARE_PROVIDER_SITE_OTHER): Payer: Medicare Other | Admitting: Cardiology

## 2021-07-25 ENCOUNTER — Other Ambulatory Visit: Payer: Self-pay

## 2021-07-25 ENCOUNTER — Encounter: Payer: Self-pay | Admitting: Cardiology

## 2021-07-25 VITALS — BP 110/70 | HR 58 | Ht 70.0 in | Wt 172.8 lb

## 2021-07-25 DIAGNOSIS — Z79899 Other long term (current) drug therapy: Secondary | ICD-10-CM | POA: Diagnosis not present

## 2021-07-25 DIAGNOSIS — I2510 Atherosclerotic heart disease of native coronary artery without angina pectoris: Secondary | ICD-10-CM

## 2021-07-25 DIAGNOSIS — I208 Other forms of angina pectoris: Secondary | ICD-10-CM

## 2021-07-25 DIAGNOSIS — E7800 Pure hypercholesterolemia, unspecified: Secondary | ICD-10-CM

## 2021-07-25 DIAGNOSIS — Z0000 Encounter for general adult medical examination without abnormal findings: Secondary | ICD-10-CM | POA: Diagnosis not present

## 2021-07-25 DIAGNOSIS — R42 Dizziness and giddiness: Secondary | ICD-10-CM | POA: Diagnosis not present

## 2021-07-25 DIAGNOSIS — I2583 Coronary atherosclerosis due to lipid rich plaque: Secondary | ICD-10-CM

## 2021-07-25 DIAGNOSIS — E559 Vitamin D deficiency, unspecified: Secondary | ICD-10-CM | POA: Diagnosis not present

## 2021-07-25 DIAGNOSIS — K5900 Constipation, unspecified: Secondary | ICD-10-CM | POA: Diagnosis not present

## 2021-07-25 LAB — LIPID PANEL
Chol/HDL Ratio: 2.2 ratio (ref 0.0–5.0)
Cholesterol, Total: 131 mg/dL (ref 100–199)
HDL: 60 mg/dL (ref >39)
LDL Chol Calc (NIH): 58 mg/dL (ref 0–99)
Triglycerides: 62 mg/dL (ref 0–149)
VLDL Cholesterol Cal: 13 mg/dL (ref 5–40)

## 2021-07-25 NOTE — Patient Instructions (Addendum)
Medication Instructions:  ?Please discontinue your Isosorbide. ?Continue all other medications as listed. ? ?*If you need a refill on your cardiac medications before your next appointment, please call your pharmacy* ? ?Lab:  please have blood work today (Lipid) ? ?Follow-Up: ?At [facility], you and your health needs are our priority.  As part of our continuing mission to provide you with exceptional heart care, we have created designated Provider Care Teams.  These Care Teams include your primary Cardiologist (physician) and Advanced Practice Providers (APPs -  Physician Assistants and Nurse Practitioners) who all work together to provide you with the care you need, when you need it. ? ?We recommend signing up for the patient portal called "MyChart".  Sign up information is provided on this After Visit Summary.  MyChart is used to connect with patients for Virtual Visits (Telemedicine).  Patients are able to view lab/test results, encounter notes, upcoming appointments, etc.  Non-urgent messages can be sent to your provider as well.   ?To learn more about what you can do with MyChart, go to [email or web address].   ? ?Your next appointment:   ?6 month(s) ? ?The format for your next appointment:   ?In Person ? ?Provider:   ?Nicholes Rough, PA-C, Christen Bame, NP, or Richardson Dopp, PA-C       ? ? ?Thank you for choosing [facility]!! ? ? ? ?

## 2021-07-25 NOTE — Assessment & Plan Note (Addendum)
Unusual.  Note that several years ago at [facility] he was noted to have visual obscurations, MRI was normal.  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  Neurology note reviewed.  Continue with aspirin 81 mg.  CTA of neck also reviewed.  I will stop the isosorbide to see if this helps.  Maybe he is having transient hypotension.  Blood pressure currently 110/70.  Sometimes at home he will register a 004 systolic.  This may be the culprit.  Continue with hydration. ?

## 2021-07-25 NOTE — Assessment & Plan Note (Addendum)
Continue with atorvastatin and Zetia as above.  LDL 70 previously, we will check again..  At goal. ?

## 2021-07-25 NOTE — Assessment & Plan Note (Signed)
Bare-metal stent to the LAD in 2000 with subsequent cardiac catheterization in 2003 that was patent, stress test 2007 no ischemia.  Overall having no anginal symptoms.  On goal-directed medical therapy.  Continue with aspirin 81 mg, also on isosorbide 30 mg a day.  Zetia 10 mg and atorvastatin 20 mg. ?

## 2021-07-25 NOTE — Progress Notes (Signed)
Cardiology Office Note:    Date:  07/25/2021   ID:  Randy [name], DOB 1941-01-07, MRN [id number]  PCP:  Randy [name].Randy Sa, MD   [place] Prov[facility] Cardiologist:  Randy Furbish, MD     Referring MD: Randy [name], Randy [name].Randy Sa, MD    History of Present Illness:    Randy [name] is a 81 y.o. male here to discuss dizzy episodes.  He has been experiencing dizziness lightheadedness going on since October of last year.  He states that it all started really weird, he was driving and all of a sudden his vision started going away.  He was having trouble seeing 20 to 30 feet in front of himself.  He reports that this lasted about 10 to 15 minutes and he stopped immediately.  He has seen Dr. Alroy [name] about this as well as neurology.  He had an MRI which showed too many strokes.  He had another MRI, just trying to figure out what this is.  He had a 3-day event monitor placed by neurology.  06/25/2021, 3-day ZIO showed the following:  Sinus rhythm.  (minimum HR of 49 bpm, max HR of 109 bpm, and avg HR of 69 bpm).  First Degree AV Block was present. Isolated SVEs were rare (<1.0%), SVE Couplets were rare (<1.0%), and SVE Triplets were rare  (<1.0%). Isolated VEs were rare (<1.0%)  He is taking meclizine.  He stated that as long as he was on the medication he seemed to be doing okay and that was working on the dizziness.  He stated he was stable but he wanted to follow-up with cardiology.  In review of my prior notes, he had blurry vision previously diagnosed with visual obscurations at [place].  MRI of the brain was normal at this time.  This was several years ago.  He has a history of bare-metal stent to the LAD in 2000, stress test in 2007 showing no ischemia, prior to that cardiac catheterization in 2003 showing patent stent.   Recent LDL 70 hemoglobin 14.1 Past Medical History:  Diagnosis Date   Arthritis    CAD (coronary artery disease)    Hypercholesterolemia    Melanoma ([place])  2009   melanoma head   Rosacea     Past Surgical History:  Procedure Laterality Date   COLONOSCOPY     CORONARY ANGIOPLASTY WITH STENT PLACEMENT  2000   left temporal melanoma     TONSILLECTOMY      Current Medications: Current Meds  Medication Sig   aspirin EC 81 MG tablet Take 1 tablet (81 mg total) by mouth daily. Swallow whole.   atorvastatin (LIPITOR) 20 MG tablet TAKE 1 TABLET BY MOUTH EVERY DAY   docusate sodium (COLACE) 100 MG capsule Take 100 mg by mouth 2 (two) times daily.   Ergocalciferol (VITAMIN D2) 50 MCG (2000 UT) TABS Take by mouth.   ezetimibe (ZETIA) 10 MG tablet Take 1 tablet (10 mg total) by mouth daily.   meclizine (ANTIVERT) 25 MG tablet 25 mg daily.   nitroGLYCERIN (NITROSTAT) 0.4 MG SL tablet PLACE 1 TAB UNDER TONGUE EVERY 5 MINS, UP TO 3 DOSES AS NEEDED FOR CHEST [name]- CALL 911. KEEP APPT   [DISCONTINUED] isosorbide mononitrate (IMDUR) 30 MG 24 hr tablet TAKE 1 TABLET BY MOUTH EVERY DAY   Current Facility-Administered Medications for the 07/25/21 encounter (Office Visit) with Randy Pain, MD  Medication   0.9 %  sodium chloride infusion     Allergies:  Patient has no known allergies.   Social History   Socioeconomic History   Marital status: Married    Spouse name: Not on file   Number of children: Not on file   Years of education: Not on file   Highest education level: Not on file  Occupational History   Not on file  Tobacco Use   Smoking status: Former   Smokeless tobacco: Never   Tobacco comments:    patient had quit for about 86yr ago  Vaping Use   Vaping Use: Never used  Substance and Sexual Activity   Alcohol use: No    Alcohol/week: 0.0 standard drinks   Drug use: No   Sexual activity: Not on file  Other Topics Concern   Not on file  Social History Narrative   Lives with wife in a one story home.  Has 2 children.     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     Education: high school      Right Handed    Social Determinants of Health   Financial Resource Strain: Not on file  Food Insecurity: Not on file  Transportation Needs: Not on file  Physical Activity: Not on file  Stress: Not on file  Social Connections: Not on file     Family History: The patient's family history includes Diabetes in his sister; Healthy in his daughter; Heart attack in his father and sister; Multiple sclerosis in his son; Stomach cancer in his mother. There is no history of Colon cancer or Esophageal cancer.  ROS:   Please see the history of present illness.     All other systems reviewed and are negative.  EKGs/Labs/Other Studies Reviewed:    The following studies were reviewed today: Cardiac catheterization 2003, patent bare-metal stent to LAD placed in 2000.  EKG:  EKG is  ordered today.  The ekg ordered today demonstrates heart rate 58 bpm sinus rhythm first-degree AV block 218 ms  Recent Labs: 08/22/2020: ALT 28; BUN 12; Creatinine, Ser 0.80; Hemoglobin 14.1; Platelets 134; Potassium 4.6; Sodium 134  Recent Lipid Panel    Component Value Date/Time   CHOL 141 08/22/2020 0916   TRIG 58 08/22/2020 0916   HDL 59 08/22/2020 0916   CHOLHDL 2.4 08/22/2020 0916   CHOLHDL 2.6 04/23/2016 0825   VLDL 20 04/23/2016 0825   LDLCALC 70 08/22/2020 0916     Risk Assessment/Calculations:              Physical Exam:    VS:  BP 110/70    Pulse (!) 58    Ht '5\' 10"'$  (1.778 m)    Wt 172 lb 12.8 oz (78.4 kg)    SpO2 98%    BMI 24.79 kg/m     Wt Readings from Last 3 Encounters:  07/25/21 172 lb 12.8 oz (78.4 kg)  06/08/21 170 lb (77.1 kg)  08/22/20 178 lb (80.7 kg)     GEN:  Well nourished, well developed in no acute distress HEENT: Normal NECK: No JVD; No carotid bruits LYMPHATICS: No lymphadenopathy CARDIAC: RRR, no murmurs, no rubs, gallops RESPIRATORY:  Clear to auscultation without rales, wheezing or rhonchi  ABDOMEN: Soft, non-tender,  non-distended MUSCULOSKELETAL:  No edema; No deformity  SKIN: Warm and dry NEUROLOGIC:  Alert and oriented x 3 PSYCHIATRIC:  Normal affect   ASSESSMENT:    1. Medication management   2. Coronary artery disease due to lipid rich plaque   3. Angina decubitus (  Randy [name])   4. Hypercholesterolemia   5. Dizziness    PLAN:    In order of problems listed above:  CAD (coronary artery disease) Bare-metal stent to the LAD in 2000 with subsequent cardiac catheterization in 2003 that was patent, stress test 2007 no ischemia.  Overall having no anginal symptoms.  On goal-directed medical therapy.  Continue with aspirin 81 mg, also on isosorbide 30 mg a day.  Zetia 10 mg and atorvastatin 20 mg.  Angina decubitus Given some of these episodes of blurry vision, dizziness, I think I will go ahead and stop his isosorbide once a day.  Hypercholesterolemia Continue with atorvastatin and Zetia as above.  LDL 70 previously, we will check again..  At goal.  Dizziness Unusual.  Note that several years ago at [facility] he was noted to have visual obscurations, MRI was normal.  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  Neurology note reviewed.  Continue with aspirin 81 mg.  CTA of neck also reviewed.  I will stop the isosorbide to see if this helps.  Maybe he is having transient hypotension.  Blood pressure currently 110/70.  Sometimes at home he will register a 956 systolic.  This may be the culprit.  Continue with hydration.         Medication Adjustments/Labs and Tests Ordered: Current medicines are reviewed at length with the patient today.  Concerns regarding medicines are outlined above.  Orders Placed This Encounter  Procedures   Lipid panel   EKG 12-Lead   No orders of the defined types were placed in this encounter.   Patient Instructions  Medication Instructions:  Please discontinue your Isosorbide. Continue all other medications as listed.  *If you need a refill on your  cardiac medications before your next appointment, please call your pharmacy*  Lab:  please have blood work today (Lipid)  Follow-Up: At [facility], you and your health needs are our priority.  As part of our continuing mission to provide you with exceptional heart care, we have created designated Provider Care Teams.  These Care Teams include your primary Cardiologist (physician) and Advanced Practice Providers (APPs -  Physician Assistants and Nurse Practitioners) who all work together to provide you with the care you need, when you need it.  We recommend signing up for the patient portal called "MyChart".  Sign up information is provided on this After Visit Summary.  MyChart is used to connect with patients for Virtual Visits (Telemedicine).  Patients are able to view lab/test results, encounter notes, upcoming appointments, etc.  Non-urgent messages can be sent to your provider as well.   To learn more about what you can do with MyChart, go to [email or web address].    Your next appointment:   6 month(s)  The format for your next appointment:   In Person  Provider:   Nicholes Rough, PA-C, Christen Bame, NP, or Richardson Dopp, PA-C         Thank you for choosing [facility]!!      Signed, Randy Furbish, MD  07/25/2021 9:32 AM    [place]

## 2021-07-25 NOTE — Assessment & Plan Note (Signed)
Given some of these episodes of blurry vision, dizziness, I think I will go ahead and stop his isosorbide once a day. ?

## 2021-07-26 ENCOUNTER — Telehealth: Payer: Self-pay | Admitting: Cardiology

## 2021-07-26 NOTE — Telephone Encounter (Signed)
Jerline Pain, MD  ?07/26/2021  6:43 AM EST   ?  ?Excellent LDL 58 at goal with atorvastatin and zetia ?Candee Furbish, MD  ? ? ?The patient has been notified of the result and verbalized understanding.  All questions (if any) were answered. Nuala Alpha, LPN 06/21/2409 46:43 PM  ? ?

## 2021-07-26 NOTE — Telephone Encounter (Signed)
Patient returned call for lab results.  

## 2021-08-03 ENCOUNTER — Ambulatory Visit: Payer: Medicare Other | Admitting: Cardiology

## 2021-08-06 DIAGNOSIS — D1801 Hemangioma of skin and subcutaneous tissue: Secondary | ICD-10-CM | POA: Diagnosis not present

## 2021-08-06 DIAGNOSIS — L821 Other seborrheic keratosis: Secondary | ICD-10-CM | POA: Diagnosis not present

## 2021-08-06 DIAGNOSIS — L812 Freckles: Secondary | ICD-10-CM | POA: Diagnosis not present

## 2021-08-06 DIAGNOSIS — L308 Other specified dermatitis: Secondary | ICD-10-CM | POA: Diagnosis not present

## 2021-08-06 DIAGNOSIS — L570 Actinic keratosis: Secondary | ICD-10-CM | POA: Diagnosis not present

## 2021-10-16 DIAGNOSIS — L820 Inflamed seborrheic keratosis: Secondary | ICD-10-CM | POA: Diagnosis not present

## 2021-10-16 DIAGNOSIS — L821 Other seborrheic keratosis: Secondary | ICD-10-CM | POA: Diagnosis not present

## 2021-11-02 ENCOUNTER — Other Ambulatory Visit: Payer: Self-pay | Admitting: Cardiology

## 2021-12-19 ENCOUNTER — Other Ambulatory Visit: Payer: Self-pay

## 2021-12-19 MED ORDER — EZETIMIBE 10 MG PO TABS: 10.0000 mg | ORAL_TABLET | Freq: Every day | ORAL | 1 refills | Status: DC

## 2022-01-12 ENCOUNTER — Other Ambulatory Visit: Payer: Self-pay | Admitting: Cardiology

## 2022-01-31 ENCOUNTER — Other Ambulatory Visit: Payer: Self-pay | Admitting: Cardiology

## 2022-02-06 DIAGNOSIS — L570 Actinic keratosis: Secondary | ICD-10-CM | POA: Diagnosis not present

## 2022-02-06 DIAGNOSIS — Z85820 Personal history of malignant melanoma of skin: Secondary | ICD-10-CM | POA: Diagnosis not present

## 2022-02-06 DIAGNOSIS — D1801 Hemangioma of skin and subcutaneous tissue: Secondary | ICD-10-CM | POA: Diagnosis not present

## 2022-02-06 DIAGNOSIS — D692 Other nonthrombocytopenic purpura: Secondary | ICD-10-CM | POA: Diagnosis not present

## 2022-02-06 DIAGNOSIS — L820 Inflamed seborrheic keratosis: Secondary | ICD-10-CM | POA: Diagnosis not present

## 2022-02-06 DIAGNOSIS — L821 Other seborrheic keratosis: Secondary | ICD-10-CM | POA: Diagnosis not present

## 2022-02-06 DIAGNOSIS — B070 Plantar wart: Secondary | ICD-10-CM | POA: Diagnosis not present

## 2022-03-19 DIAGNOSIS — Z961 Presence of intraocular lens: Secondary | ICD-10-CM | POA: Diagnosis not present

## 2022-03-19 DIAGNOSIS — H2511 Age-related nuclear cataract, right eye: Secondary | ICD-10-CM | POA: Diagnosis not present

## 2022-03-19 DIAGNOSIS — H52203 Unspecified astigmatism, bilateral: Secondary | ICD-10-CM | POA: Diagnosis not present

## 2022-03-25 ENCOUNTER — Ambulatory Visit: Payer: Medicare Other | Attending: Cardiology | Admitting: Cardiology

## 2022-03-25 ENCOUNTER — Encounter: Payer: Self-pay | Admitting: Cardiology

## 2022-03-25 VITALS — BP 114/60 | HR 60 | Ht 70.0 in | Wt 169.0 lb

## 2022-03-25 DIAGNOSIS — I2583 Coronary atherosclerosis due to lipid rich plaque: Secondary | ICD-10-CM | POA: Diagnosis not present

## 2022-03-25 DIAGNOSIS — E7800 Pure hypercholesterolemia, unspecified: Secondary | ICD-10-CM | POA: Insufficient documentation

## 2022-03-25 DIAGNOSIS — Z79899 Other long term (current) drug therapy: Secondary | ICD-10-CM | POA: Insufficient documentation

## 2022-03-25 DIAGNOSIS — I2510 Atherosclerotic heart disease of native coronary artery without angina pectoris: Secondary | ICD-10-CM | POA: Diagnosis not present

## 2022-03-25 DIAGNOSIS — I2089 Other forms of angina pectoris: Secondary | ICD-10-CM | POA: Diagnosis not present

## 2022-03-25 NOTE — Patient Instructions (Signed)
Medication Instructions:  The current medical regimen is effective;  continue present plan and medications.  *If you need a refill on your cardiac medications before your next appointment, please call your pharmacy*  Lab Work: Please have blood work today   (CBC, CMP and Lipid)  If you have labs (blood work) drawn today and your tests are completely normal, you will receive your results only by: MyChart Message (if you have MyChart) OR A paper copy in the mail If you have any lab test that is abnormal or we need to change your treatment, we will call you to review the results.  Follow-Up: At [facility], you and your health needs are our priority.  As part of our continuing mission to provide you with exceptional heart care, we have created designated Provider Care Teams.  These Care Teams include your primary Cardiologist (physician) and Advanced Practice Providers (APPs -  Physician Assistants and Nurse Practitioners) who all work together to provide you with the care you need, when you need it.  We recommend signing up for the patient portal called "MyChart".  Sign up information is provided on this After Visit Summary.  MyChart is used to connect with patients for Virtual Visits (Telemedicine).  Patients are able to view lab/test results, encounter notes, upcoming appointments, etc.  Non-urgent messages can be sent to your provider as well.   To learn more about what you can do with MyChart, go to [email or web address].    Your next appointment:   6 month(s)  The format for your next appointment:   In Person  Provider:   Candee Furbish, MD     Important Information About Sugar

## 2022-03-25 NOTE — Progress Notes (Signed)
Cardiology Office Note:    Date:  03/25/2022   ID:  Randy [name], DOB 1941-04-17, MRN [id number]  PCP:  Aurea Graff.[name], MD   [place] Prov[facility] Cardiologist:  Candee Furbish, MD     Referring MD: Alroy Dust, Carlean Jews.[name], MD    History of Present Illness:    Randy [name] is a 81 y.o. male here for follow-up dizzy episodes.  Thankfully these have all gotten better since stopping the isosorbide.  From a prior note-he states that it all started really weird, he was driving and all of a sudden his vision started going away.  He was having trouble seeing 20 to 30 feet in front of himself.  He reports that this lasted about 10 to 15 minutes and he stopped immediately.  He has seen Dr. Alroy Dust about this as well as neurology.  He had an MRI which showed too many strokes.  He had another MRI, just trying to figure out what this is.  He had a 3-day event monitor placed by neurology.  06/25/2021, 3-day ZIO showed the following:  Sinus rhythm.  (minimum HR of 49 bpm, max HR of 109 bpm, and avg HR of 69 bpm).  First Degree AV Block was present. Isolated SVEs were rare (<1.0%), SVE Couplets were rare (<1.0%), and SVE Triplets were rare  (<1.0%). Isolated VEs were rare (<1.0%)  He is taking meclizine.  He stated that as long as he was on the medication he seemed to be doing okay and that was working on the dizziness.  He stated he was stable but he wanted to follow-up with cardiology.  In review of my prior notes, he had blurry vision previously diagnosed with visual obscurations at [place].  MRI of the brain was normal at this time.  This was several years ago.  He has a history of bare-metal stent to the LAD in 2000, stress test in 2007 showing no ischemia, prior to that cardiac catheterization in 2003 showing patent stent.  Recent LDL 70 hemoglobin 14.1  Today, he has been doing well. He noted experiencing ankle pain with no edema with associated symptoms of leg  tightness/soreness. He has been consistent with hydration to improve the leg tightness.  At night, he has been experiencing back pain that would resolve by the morning.   He has been consistent with his physical activity. Most of his physical activity consists of doing yard work or outdoor activities.  He denies any palpitations, chest pain, shortness of breath, or peripheral edema. No lightheadedness, headaches, syncope, orthopnea, or PND.  Past Medical History:  Diagnosis Date   Arthritis    CAD (coronary artery disease)    Hypercholesterolemia    Melanoma ([place]) 2009   melanoma head   Rosacea     Past Surgical History:  Procedure Laterality Date   COLONOSCOPY     CORONARY ANGIOPLASTY WITH STENT PLACEMENT  2000   left temporal melanoma     TONSILLECTOMY      Current Medications: Current Meds  Medication Sig   aspirin EC 81 MG tablet Take 1 tablet (81 mg total) by mouth daily. Swallow whole.   atorvastatin (LIPITOR) 20 MG tablet Take 1 tablet (20 mg total) by mouth daily. Must keep upcoming appointment for future refills. Thank you.   docusate sodium (COLACE) 100 MG capsule Take 100 mg by mouth 2 (two) times daily.   Ergocalciferol (VITAMIN D2) 50 MCG (2000 UT) TABS Take by mouth.   ezetimibe (ZETIA) 10  MG tablet Take 1 tablet (10 mg total) by mouth daily.   meclizine (ANTIVERT) 25 MG tablet 25 mg daily.   nitroGLYCERIN (NITROSTAT) 0.4 MG SL tablet PLACE 1 TAB UNDER TONGUE EVERY 5 MINS, UP TO 3 DOSES AS NEEDED FOR CHEST PAIN- CALL 911. KEEP APPT   Current Facility-Administered Medications for the 03/25/22 encounter (Office Visit) with Jerline Pain, MD  Medication   0.9 %  sodium chloride infusion     Allergies:   Patient has no known allergies.   Social History   Socioeconomic History   Marital status: Married    Spouse name: Not on file   Number of children: Not on file   Years of education: Not on file   Highest education level: Not on file  Occupational History    Not on file  Tobacco Use   Smoking status: Former   Smokeless tobacco: Never   Tobacco comments:    patient had quit for about 42yr ago  Vaping Use   Vaping Use: Never used  Substance and Sexual Activity   Alcohol use: No    Alcohol/week: 0.0 standard drinks of alcohol   Drug use: No   Sexual activity: Not on file  Other Topics Concern   Not on file  Social History Narrative   Lives with wife in a one story home.  Has 2 children.     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    Education: high school      Right Handed    Social Determinants of Health   Financial Resource Strain: Not on file  Food Insecurity: Not on file  Transportation Needs: Not on file  Physical Activity: Not on file  Stress: Not on file  Social Connections: Not on file     Family History: The patient's family history includes Diabetes in his sister; Healthy in his daughter; Heart attack in his father and sister; Multiple sclerosis in his son; Stomach cancer in his mother. There is no history of Colon cancer or Esophageal cancer.  ROS:   Please see the history of present illness.  (+) Leg tightness  All other systems reviewed and are negative.  EKGs/Labs/Other Studies Reviewed:    The following studies were reviewed today:  CT Angio Head and Neck 07/02/2021: MPRESSION: 1. 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 No stenosis or embolic source seen in major arteries of the head and neck. 2. Right maxillary sinusitis that has progressed from 01/04/2021 brain MRI. A right dental implant projects into the lower sinus, suggest dental follow-up.  Cardiac catheterization 2003, patent bare-metal stent to LAD placed in 2000.  EKG: EKG is personally reviewed.  07/25/2021: 58 bpm, sinus rhythm first-degree AV block 218 ms  Recent Labs: No results found for requested labs within last 365 days.  Recent Lipid Panel    Component Value Date/Time   CHOL 131  07/25/2021 0903   TRIG 62 07/25/2021 0903   HDL 60 07/25/2021 0903   CHOLHDL 2.2 07/25/2021 0903   CHOLHDL 2.6 04/23/2016 0825   VLDL 20 04/23/2016 0825   LDLCALC 58 07/25/2021 0903     Risk Assessment/Calculations:              Physical Exam:    VS:  BP 114/60 (BP Location: Left Arm, Patient Position: Sitting, Cuff Size: Normal)   Pulse 60   Ht '5\' 10"'$  (1.778 m)   Wt 169 lb (76.7 kg)   SpO2 94%   BMI 24.25  kg/m     Wt Readings from Last 3 Encounters:  03/25/22 169 lb (76.7 kg)  07/25/21 172 lb 12.8 oz (78.4 kg)  06/08/21 170 lb (77.1 kg)     GEN:  Well nourished, well developed in no acute distress HEENT: Normal NECK: No JVD; No carotid bruits LYMPHATICS: No lymphadenopathy CARDIAC: RRR, no murmurs, no rubs, gallops RESPIRATORY:  Clear to auscultation without rales, wheezing or rhonchi  ABDOMEN: Soft, non-tender, non-distended MUSCULOSKELETAL:  No edema; No deformity  SKIN: Warm and dry NEUROLOGIC:  Alert and oriented x 3 PSYCHIATRIC:  Normal affect   ASSESSMENT:    1. Medication management   2. Coronary artery disease due to lipid rich plaque   3. Hypercholesterolemia     PLAN:    In order of problems listed above:   CAD (coronary artery disease) Bare-metal stent to the LAD in 2000 with subsequent cardiac catheterization in 2003 that was patent, stress test 2007 no ischemia. Overall having no anginal symptoms.  On goal-directed medical therapy.  Continue with aspirin 81 mg,  Zetia 10 mg and atorvastatin 20 mg.  Checking lipid panel today.  Overall doing well.  We stopped his isosorbide back in March 2023 and he is feeling better.   Hypercholesterolemia Continue with atorvastatin and Zetia as above.  LDL 58 previously, we will check again.   Dizziness Seems to be doing better with this after stopping the isosorbide.  Previously unusual.  Note that several years ago at [facility] he was noted to have visual obscurations, MRI was normal.  Recent MRI showed 2  small old infarcts parietal and cerebellar.  Neurology note reviewed.  Continue with aspirin 81 mg.  CTA of neck also reviewed.   Leg cramping Discussed stretching hydration etc.  He enjoys working in his garden. Low back pain occasionally as well discussed some stretching exercises.   Follow-up: 13-month  Medication Adjustments/Labs and Tests Ordered: Current medicines are reviewed at length with the patient today.  Concerns regarding medicines are outlined above.  Orders Placed This Encounter  Procedures   CBC   Lipid panel   Comprehensive metabolic panel   No orders of the defined types were placed in this encounter.   Patient Instructions  Medication Instructions:  The current medical regimen is effective;  continue present plan and medications.  *If you need a refill on your cardiac medications before your next appointment, please call your pharmacy*  Lab Work: Please have blood work today   (CBC, CMP and Lipid)  If you have labs (blood work) drawn today and your tests are completely normal, you will receive your results only by: MyChart Message (if you have MyChart) OR A paper copy in the mail If you have any lab test that is abnormal or we need to change your treatment, we will call you to review the results.  Follow-Up: At [facility] you and your health needs are our priority.  As part of our continuing mission to provide you with exceptional heart care, we have created designated Provider Care Teams.  These Care Teams include your primary Cardiologist (physician) and Advanced Practice Providers (APPs -  Physician Assistants and Nurse Practitioners) who all work together to provide you with the care you need, when you need it.  We recommend signing up for the patient portal called "MyChart".  Sign up information is provided on this After Visit Summary.  MyChart is used to connect with patients for Virtual Visits (Telemedicine).  Patients are able to view  lab/test results, encounter notes, upcoming appointments, etc.  Non-urgent messages can be sent to your provider as well.   To learn more about what you can do with MyChart, go to [email or web address].    Your next appointment:   6 month(s)  The format for your next appointment:   In Person  Provider:   Candee Furbish, MD     Important Information About Sugar          Ericka Pontiff as a scribe for Candee Furbish, MD.,have documented all relevant documentation on the behalf of Candee Furbish, MD,as directed by  Candee Furbish, MD while in the presence of Candee Furbish, MD.  I, Candee Furbish, MD, have reviewed all documentation for this visit. The documentation on 03/25/22 for the exam, diagnosis, procedures, and orders are all accurate and complete.   Signed, Candee Furbish, MD  03/25/2022 9:22 AM    [facility]

## 2022-03-26 LAB — LIPID PANEL
Chol/HDL Ratio: 1.9 ratio (ref 0.0–5.0)
Cholesterol, Total: 127 mg/dL (ref 100–199)
HDL: 66 mg/dL (ref >39)
LDL Chol Calc (NIH): 48 mg/dL (ref 0–99)
Triglycerides: 62 mg/dL (ref 0–149)
VLDL Cholesterol Cal: 13 mg/dL (ref 5–40)

## 2022-03-26 LAB — CBC
Hematocrit: 42.3 % (ref 37.5–51.0)
Hemoglobin: 14.4 g/dL (ref 13.0–17.7)
MCH: 33 pg (ref 26.6–33.0)
MCHC: 34 g/dL (ref 31.5–35.7)
MCV: 97 fL (ref 79–97)
Platelets: 147 10*3/uL — ABNORMAL LOW (ref 150–450)
RBC: 4.37 x10E6/uL (ref 4.14–5.80)
RDW: 11.8 % (ref 11.6–15.4)
WBC: 4.4 10*3/uL (ref 3.4–10.8)

## 2022-03-26 LAB — COMPREHENSIVE METABOLIC PANEL
ALT: 18 IU/L (ref 0–44)
AST: 27 IU/L (ref 0–40)
Albumin/Globulin Ratio: 2.4 — ABNORMAL HIGH (ref 1.2–2.2)
Albumin: 4.3 g/dL (ref 3.8–4.8)
Alkaline Phosphatase: 96 IU/L (ref 44–121)
BUN/Creatinine Ratio: 13 (ref 10–24)
BUN: 10 mg/dL (ref 8–27)
Bilirubin Total: 0.6 mg/dL (ref 0.0–1.2)
CO2: 27 mmol/L (ref 20–29)
Calcium: 9.4 mg/dL (ref 8.6–10.2)
Chloride: 97 mmol/L (ref 96–106)
Creatinine, Ser: 0.8 mg/dL (ref 0.76–1.27)
Globulin, Total: 1.8 g/dL (ref 1.5–4.5)
Glucose: 89 mg/dL (ref 70–99)
Potassium: 4.3 mmol/L (ref 3.5–5.2)
Sodium: 133 mmol/L — ABNORMAL LOW (ref 134–144)
Total Protein: 6.1 g/dL (ref 6.0–8.5)
eGFR: 89 mL/min/{1.73_m2} (ref >59)

## 2022-03-28 ENCOUNTER — Telehealth: Payer: Self-pay | Admitting: Cardiology

## 2022-03-28 NOTE — Telephone Encounter (Signed)
Patient returned call for lab results.  

## 2022-03-28 NOTE — Telephone Encounter (Signed)
Reviewed results of labs with pt who was very pleased.  All questions, if any were answered at the time of the call.

## 2022-04-02 ENCOUNTER — Other Ambulatory Visit: Payer: Self-pay | Admitting: Cardiology

## 2022-04-15 DIAGNOSIS — M545 Low back pain, unspecified: Secondary | ICD-10-CM | POA: Diagnosis not present

## 2022-06-25 ENCOUNTER — Other Ambulatory Visit: Payer: Self-pay

## 2022-06-25 MED ORDER — EZETIMIBE 10 MG PO TABS: 10.0000 mg | ORAL_TABLET | Freq: Every day | ORAL | 2 refills | Status: DC

## 2022-06-28 ENCOUNTER — Other Ambulatory Visit: Payer: Self-pay

## 2022-06-28 MED ORDER — EZETIMIBE 10 MG PO TABS: 10.0000 mg | ORAL_TABLET | Freq: Every day | ORAL | 3 refills | Status: DC

## 2022-07-04 DIAGNOSIS — C44222 Squamous cell carcinoma of skin of right ear and external auricular canal: Secondary | ICD-10-CM | POA: Diagnosis not present

## 2022-07-04 DIAGNOSIS — L820 Inflamed seborrheic keratosis: Secondary | ICD-10-CM | POA: Diagnosis not present

## 2022-07-04 DIAGNOSIS — L570 Actinic keratosis: Secondary | ICD-10-CM | POA: Diagnosis not present

## 2022-07-04 DIAGNOSIS — L821 Other seborrheic keratosis: Secondary | ICD-10-CM | POA: Diagnosis not present

## 2022-07-04 DIAGNOSIS — D485 Neoplasm of uncertain behavior of skin: Secondary | ICD-10-CM | POA: Diagnosis not present

## 2022-07-18 DIAGNOSIS — E559 Vitamin D deficiency, unspecified: Secondary | ICD-10-CM | POA: Diagnosis not present

## 2022-07-18 DIAGNOSIS — C4490 Unspecified malignant neoplasm of skin, unspecified: Secondary | ICD-10-CM | POA: Diagnosis not present

## 2022-08-06 ENCOUNTER — Other Ambulatory Visit: Payer: Self-pay | Admitting: Family Medicine

## 2022-08-06 ENCOUNTER — Ambulatory Visit
Admission: RE | Admit: 2022-08-06 | Discharge: 2022-08-06 | Disposition: A | Discharge status: Home / Self Care | Payer: Medicare Other | Source: Ambulatory Visit | Attending: Family Medicine | Admitting: Family Medicine

## 2022-08-06 DIAGNOSIS — G629 Polyneuropathy, unspecified: Secondary | ICD-10-CM | POA: Diagnosis not present

## 2022-08-06 DIAGNOSIS — J849 Interstitial pulmonary disease, unspecified: Secondary | ICD-10-CM | POA: Diagnosis not present

## 2022-08-06 DIAGNOSIS — E559 Vitamin D deficiency, unspecified: Secondary | ICD-10-CM | POA: Diagnosis not present

## 2022-08-06 DIAGNOSIS — M009 Pyogenic arthritis, unspecified: Secondary | ICD-10-CM | POA: Diagnosis not present

## 2022-08-06 DIAGNOSIS — M25552 Pain in left hip: Secondary | ICD-10-CM | POA: Diagnosis not present

## 2022-08-06 DIAGNOSIS — Z0000 Encounter for general adult medical examination without abnormal findings: Secondary | ICD-10-CM | POA: Diagnosis not present

## 2022-08-06 DIAGNOSIS — R9389 Abnormal findings on diagnostic imaging of other specified body structures: Secondary | ICD-10-CM | POA: Diagnosis not present

## 2022-08-06 DIAGNOSIS — I2510 Atherosclerotic heart disease of native coronary artery without angina pectoris: Secondary | ICD-10-CM | POA: Diagnosis not present

## 2022-08-06 DIAGNOSIS — E7800 Pure hypercholesterolemia, unspecified: Secondary | ICD-10-CM | POA: Diagnosis not present

## 2022-08-06 DIAGNOSIS — R918 Other nonspecific abnormal finding of lung field: Secondary | ICD-10-CM | POA: Diagnosis not present

## 2022-08-06 DIAGNOSIS — M545 Low back pain, unspecified: Secondary | ICD-10-CM | POA: Diagnosis not present

## 2022-08-06 DIAGNOSIS — K5900 Constipation, unspecified: Secondary | ICD-10-CM | POA: Diagnosis not present

## 2022-08-13 ENCOUNTER — Other Ambulatory Visit (HOSPITAL_COMMUNITY): Payer: Self-pay | Admitting: Family Medicine

## 2022-08-13 DIAGNOSIS — R9389 Abnormal findings on diagnostic imaging of other specified body structures: Secondary | ICD-10-CM

## 2022-08-15 DIAGNOSIS — D692 Other nonthrombocytopenic purpura: Secondary | ICD-10-CM | POA: Diagnosis not present

## 2022-08-15 DIAGNOSIS — L821 Other seborrheic keratosis: Secondary | ICD-10-CM | POA: Diagnosis not present

## 2022-08-15 DIAGNOSIS — D1801 Hemangioma of skin and subcutaneous tissue: Secondary | ICD-10-CM | POA: Diagnosis not present

## 2022-08-15 DIAGNOSIS — L570 Actinic keratosis: Secondary | ICD-10-CM | POA: Diagnosis not present

## 2022-08-15 DIAGNOSIS — Z85820 Personal history of malignant melanoma of skin: Secondary | ICD-10-CM | POA: Diagnosis not present

## 2022-08-15 DIAGNOSIS — L820 Inflamed seborrheic keratosis: Secondary | ICD-10-CM | POA: Diagnosis not present

## 2022-08-15 DIAGNOSIS — L817 Pigmented purpuric dermatosis: Secondary | ICD-10-CM | POA: Diagnosis not present

## 2022-08-15 DIAGNOSIS — Z85828 Personal history of other malignant neoplasm of skin: Secondary | ICD-10-CM | POA: Diagnosis not present

## 2022-09-05 ENCOUNTER — Ambulatory Visit (HOSPITAL_COMMUNITY)
Admission: RE | Admit: 2022-09-05 | Discharge: 2022-09-05 | Disposition: A | Discharge status: Home / Self Care | Payer: Medicare Other | Source: Ambulatory Visit | Attending: Family Medicine | Admitting: Family Medicine

## 2022-09-05 DIAGNOSIS — R9389 Abnormal findings on diagnostic imaging of other specified body structures: Secondary | ICD-10-CM

## 2022-09-05 DIAGNOSIS — J189 Pneumonia, unspecified organism: Secondary | ICD-10-CM | POA: Diagnosis not present

## 2022-09-05 DIAGNOSIS — J8410 Pulmonary fibrosis, unspecified: Secondary | ICD-10-CM | POA: Diagnosis not present

## 2022-09-11 ENCOUNTER — Other Ambulatory Visit (HOSPITAL_COMMUNITY): Payer: Self-pay | Admitting: Family Medicine

## 2022-09-11 DIAGNOSIS — I2510 Atherosclerotic heart disease of native coronary artery without angina pectoris: Secondary | ICD-10-CM

## 2022-09-22 NOTE — Progress Notes (Unsigned)
Synopsis: Referred for pulmonary nodule by Clovis Riley, L.August Saucer, MD  Subjective:   PATIENT ID: Randy [name] GENDER: male DOB: August 10, 1940, MRN: [id number]  No chief complaint on file.  82yM with history of CAD, melanoma, PH referred for pulmonary nodules  Otherwise pertinent review of systems is negative.  Past Medical History:  Diagnosis Date   Arthritis    CAD (coronary artery disease)    Hypercholesterolemia    Melanoma (HCC) 2009   melanoma head   Rosacea      Family History  Problem Relation Age of Onset   Stomach cancer Mother    Heart attack Father        Deceased, 75   Diabetes Sister    Heart attack Sister    Multiple sclerosis Son    Healthy Daughter    Colon cancer Neg Hx    Esophageal cancer Neg Hx      Past Surgical History:  Procedure Laterality Date   COLONOSCOPY     CORONARY ANGIOPLASTY WITH STENT PLACEMENT  2000   left temporal melanoma     TONSILLECTOMY      Social History   Socioeconomic History   Marital status: Married    Spouse name: Not on file   Number of children: Not on file   Years of education: Not on file   Highest education level: Not on file  Occupational History   Not on file  Tobacco Use   Smoking status: Former   Smokeless tobacco: Never   Tobacco comments:    patient had quit for about 85yrs ago  Vaping Use   Vaping Use: Never used  Substance and Sexual Activity   Alcohol use: No    Alcohol/week: 0.0 standard drinks of alcohol   Drug use: No   Sexual activity: Not on file  Other Topics Concern   Not on file  Social History Narrative   Lives with wife in a one story home.  Has 2 children.     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    Education: high school      Right Handed    Social Determinants of Health   Financial Resource Strain: Not on file  Food Insecurity: Not on file  Transportation Needs: Not on file  Physical Activity: Not on file   Stress: Not on file  Social Connections: Not on file  Intimate Partner Violence: Not on file     No Known Allergies   Outpatient Medications Prior to Visit  Medication Sig Dispense Refill   aspirin EC 81 MG tablet Take 1 tablet (81 mg total) by mouth daily. Swallow whole. 90 tablet 3   atorvastatin (LIPITOR) 20 MG tablet TAKE 1 TABLET BY MOUTH DAILY. MUST KEEP UPCOMING APPOINTMENT FOR FUTURE REFILLS. 90 tablet 1   docusate sodium (COLACE) 100 MG capsule Take 100 mg by mouth 2 (two) times daily.     Ergocalciferol (VITAMIN D2) 50 MCG (2000 UT) TABS Take by mouth.     ezetimibe (ZETIA) 10 MG tablet Take 1 tablet (10 mg total) by mouth daily. 90 tablet 3   meclizine (ANTIVERT) 25 MG tablet 25 mg daily.     nitroGLYCERIN (NITROSTAT) 0.4 MG SL tablet PLACE 1 TAB UNDER TONGUE EVERY 5 MINS, UP TO 3 DOSES AS NEEDED FOR CHEST PAIN- CALL 911. KEEP APPT 25 tablet 7   Facility-Administered Medications Prior to Visit  Medication Dose Route Frequency Provider Last Rate Last Admin   0.9 %  sodium chloride infusion  500 mL Intravenous Once Meryl Dare, MD           Objective:   Physical Exam:  General appearance: 82 y.o., male, NAD, conversant  Eyes: anicteric sclerae; PERRL, tracking appropriately HENT: NCAT; MMM Neck: Trachea midline; no lymphadenopathy, no JVD Lungs: CTAB, no crackles, no wheeze, with normal respiratory effort CV: RRR, no murmur  Abdomen: Soft, non-tender; non-distended, BS present  Extremities: No peripheral edema, warm Skin: Normal turgor and texture; no rash Psych: Appropriate affect Neuro: Alert and oriented to person and place, no focal deficit     There were no vitals filed for this visit.   on *** LPM *** RA BMI Readings from Last 3 Encounters:  03/25/22 24.25 kg/m  07/25/21 24.79 kg/m  06/08/21 24.39 kg/m   Wt Readings from Last 3 Encounters:  03/25/22 169 lb (76.7 kg)  07/25/21 172 lb 12.8 oz (78.4 kg)  06/08/21 170 lb (77.1 kg)      CBC    Component Value Date/Time   WBC 4.4 03/25/2022 0922   RBC 4.37 03/25/2022 0922   HGB 14.4 03/25/2022 0922   HCT 42.3 03/25/2022 0922   PLT 147 (L) 03/25/2022 0922   MCV 97 03/25/2022 0922   MCH 33.0 03/25/2022 0922   MCHC 34.0 03/25/2022 0922   RDW 11.8 03/25/2022 0922     Chest Imaging: 4/18 CT Chest with part solid 1.3cm RUL nodule, 7mm LLL nodule, scattered subpleural fibrosis/reticulation  Pulmonary Functions Testing Results:     No data to display          FeNO: ***  Pathology: ***  Echocardiogram: ***  Heart Catheterization: ***    Assessment & Plan:    Plan:  - HRCT Chest in 3 months - PFT - TTE    Omar Person, MD [facility] 09/22/2022 8:42 AM

## 2022-09-23 ENCOUNTER — Encounter: Payer: Self-pay | Admitting: Student

## 2022-09-23 ENCOUNTER — Ambulatory Visit (INDEPENDENT_AMBULATORY_CARE_PROVIDER_SITE_OTHER): Payer: Medicare Other | Admitting: Student

## 2022-09-23 VITALS — BP 130/70 | HR 66 | Temp 97.5°F | Ht 70.0 in | Wt 173.6 lb

## 2022-09-23 DIAGNOSIS — R0609 Other forms of dyspnea: Secondary | ICD-10-CM | POA: Diagnosis not present

## 2022-09-23 DIAGNOSIS — J849 Interstitial pulmonary disease, unspecified: Secondary | ICD-10-CM | POA: Diagnosis not present

## 2022-09-23 NOTE — Patient Instructions (Signed)
-   CT Chest in 3 months - PFTs (breathing tests) in 4 months on same day as next clinic visit

## 2022-09-27 ENCOUNTER — Ambulatory Visit: Payer: Medicare Other | Attending: Cardiology | Admitting: Cardiology

## 2022-09-27 ENCOUNTER — Encounter: Payer: Self-pay | Admitting: Cardiology

## 2022-09-27 ENCOUNTER — Encounter: Payer: Self-pay | Admitting: *Deleted

## 2022-09-27 VITALS — BP 140/82 | HR 77 | Ht 70.0 in | Wt 170.0 lb

## 2022-09-27 DIAGNOSIS — I2510 Atherosclerotic heart disease of native coronary artery without angina pectoris: Secondary | ICD-10-CM | POA: Diagnosis not present

## 2022-09-27 DIAGNOSIS — R072 Precordial pain: Secondary | ICD-10-CM

## 2022-09-27 DIAGNOSIS — R0602 Shortness of breath: Secondary | ICD-10-CM

## 2022-09-27 NOTE — Progress Notes (Signed)
Cardiology Office Note:    Date:  09/27/2022   ID:  Randy [name], DOB 15-May-1941, MRN [id number]  PCP:  Asencion Gowda.August Saucer, MD   [facility] Providers Cardiologist:  Donato Schultz, MD     Referring MD: Clovis Riley, Elbert Ewings.August Saucer, MD    History of Present Illness:    Randy [name] is a 82 y.o. male here for follow-up dizzy episodes.  Thankfully these have all gotten better since stopping the isosorbide.  From a prior note-he states that it all started really weird, he was driving and all of a sudden his vision started going away.  He was having trouble seeing 20 to 30 feet in front of himself.  He reports that this lasted about 10 to 15 minutes and he stopped immediately.  He has seen Dr. Clovis Riley about this as well as neurology.  He had an MRI which showed too many strokes.  He had another MRI, just trying to figure out what this is.  He had a 3-day event monitor placed by neurology.  06/25/2021, 3-day ZIO showed the following:  Sinus rhythm.  (minimum HR of 49 bpm, max HR of 109 bpm, and avg HR of 69 bpm).  First Degree AV Block was present. Isolated SVEs were rare (<1.0%), SVE Couplets were rare (<1.0%), and SVE Triplets were rare  (<1.0%). Isolated VEs were rare (<1.0%)  He is taking meclizine.  He stated that as long as he was on the medication he seemed to be doing okay and that was working on the dizziness.  He stated he was stable but he wanted to follow-up with cardiology.  In review of my prior notes, he had blurry vision previously diagnosed with visual obscurations at [facility].  MRI of the brain was normal at this time.  This was several years ago.  He has a history of bare-metal stent to the LAD in 2000, stress test in 2007 showing no ischemia, prior to that cardiac catheterization in 2003 showing patent stent.  Recent LDL 70 hemoglobin 14.1  Today, he has been doing well. He noted experiencing ankle pain with no edema with associated symptoms of leg  tightness/soreness. He has been consistent with hydration to improve the leg tightness.  At night, he has been experiencing back pain that would resolve by the morning.   He has been consistent with his physical activity. Most of his physical activity consists of doing yard work or outdoor activities.  He denies any palpitations, chest pain, shortness of breath, or peripheral edema. No lightheadedness, headaches, syncope, orthopnea, or PND.  Past Medical History:  Diagnosis Date   Arthritis    CAD (coronary artery disease)    Hypercholesterolemia    Melanoma (HCC) 2009   melanoma head   Rosacea     Past Surgical History:  Procedure Laterality Date   COLONOSCOPY     CORONARY ANGIOPLASTY WITH STENT PLACEMENT  2000   left temporal melanoma     TONSILLECTOMY      Current Medications: Current Meds  Medication Sig   aspirin EC 81 MG tablet Take 1 tablet (81 mg total) by mouth daily. Swallow whole.   atorvastatin (LIPITOR) 20 MG tablet TAKE 1 TABLET BY MOUTH DAILY. MUST KEEP UPCOMING APPOINTMENT FOR FUTURE REFILLS.   docusate sodium (COLACE) 100 MG capsule Take 100 mg by mouth 2 (two) times daily.   Ergocalciferol (VITAMIN D2) 50 MCG (2000 UT) TABS Take by mouth.   ezetimibe (ZETIA) 10 MG tablet Take 1 tablet (  10 mg total) by mouth daily.   meclizine (ANTIVERT) 25 MG tablet 25 mg daily.   meloxicam (MOBIC) 15 MG tablet Take 15 mg by mouth daily as needed.   nitroGLYCERIN (NITROSTAT) 0.4 MG SL tablet PLACE 1 TAB UNDER TONGUE EVERY 5 MINS, UP TO 3 DOSES AS NEEDED FOR CHEST PAIN- CALL 911. KEEP APPT   Current Facility-Administered Medications for the 09/27/22 encounter (Office Visit) with Jake Bathe, MD  Medication   0.9 %  sodium chloride infusion     Allergies:   Patient has no known allergies.   Social History   Socioeconomic History   Marital status: Married    Spouse name: Not on file   Number of children: Not on file   Years of education: Not on file   Highest  education level: Not on file  Occupational History   Not on file  Tobacco Use   Smoking status: Former    Packs/day: 2.00    Years: 14.00    Additional pack years: 0.00    Total pack years: 28.00    Types: Cigarettes    Start date: 09/17/1957    Quit date: 06/21/1971    Years since quitting: 51.3   Smokeless tobacco: Never   Tobacco comments:    patient had quit for about 44yrs ago  Vaping Use   Vaping Use: Never used  Substance and Sexual Activity   Alcohol use: No    Alcohol/week: 0.0 standard drinks of alcohol   Drug use: No   Sexual activity: Not on file  Other Topics Concern   Not on file  Social History Narrative   Lives with wife in a one story home.  Has 2 children.     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    Education: high school      Right Handed    Social Determinants of Health   Financial Resource Strain: Not on file  Food Insecurity: Not on file  Transportation Needs: Not on file  Physical Activity: Not on file  Stress: Not on file  Social Connections: Not on file     Family History: The patient's family history includes Diabetes in his sister; Healthy in his daughter; Heart attack in his father and sister; Multiple sclerosis in his son; Stomach cancer in his mother. There is no history of Colon cancer or Esophageal cancer.  ROS:   Please see the history of present illness.  (+) Leg tightness  All other systems reviewed and are negative.  EKGs/Labs/Other Studies Reviewed:    The following studies were reviewed today:  CT Angio Head and Neck 07/02/2021: MPRESSION: 1. 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 No stenosis or embolic source seen in major arteries of the head and neck. 2. Right maxillary sinusitis that has progressed from 01/04/2021 brain MRI. A right dental implant projects into the lower sinus, suggest dental follow-up.  Cardiac catheterization 2003, patent bare-metal stent to LAD  placed in 2000.  EKG: EKG is personally reviewed.  09/27/2022: Normal sinus rhythm 77 no other changes.  No longer has first-degree AV block's EKG. 07/25/2021: 58 bpm, sinus rhythm first-degree AV block 218 ms  Recent Labs: 03/25/2022: ALT 18; BUN 10; Creatinine, Ser 0.80; Hemoglobin 14.4; Platelets 147; Potassium 4.3; Sodium 133  Recent Lipid Panel    Component Value Date/Time   CHOL 127 03/25/2022 0922   TRIG 62 03/25/2022 0922   HDL 66 03/25/2022 0922   CHOLHDL 1.9 03/25/2022 6387  CHOLHDL 2.6 04/23/2016 0825   VLDL 20 04/23/2016 0825   LDLCALC 48 03/25/2022 0922     Risk Assessment/Calculations:              Physical Exam:    VS:  BP (!) 140/82 (BP Location: Left Arm, Patient Position: Sitting, Cuff Size: Normal)   Pulse 77   Ht 5\' 10"  (1.778 m)   Wt 170 lb (77.1 kg)   BMI 24.39 kg/m     Wt Readings from Last 3 Encounters:  09/27/22 170 lb (77.1 kg)  09/23/22 173 lb 9.6 oz (78.7 kg)  03/25/22 169 lb (76.7 kg)     GEN: Well nourished, well developed, in no acute distress HEENT: normal Neck: no JVD, carotid bruits, or masses Cardiac: RRR; no murmurs, rubs, or gallops,no edema  Respiratory:  clear to auscultation bilaterally, normal work of breathing GI: soft, nontender, nondistended, + BS MS: no deformity or atrophy Skin: warm and dry, no rash Neuro:  Alert and Oriented x 3, Strength and sensation are intact Psych: euthymic mood, full affect   ASSESSMENT:    1. Shortness of breath   2. Precordial pain   3. Coronary artery disease involving native coronary artery of native heart, unspecified whether angina present      PLAN:    In order of problems listed above:   CAD (coronary artery disease) Bare-metal stent to the LAD in 2000 with subsequent cardiac catheterization in 2003 that was patent, stress test 2007 no ischemia.  Has been experiencing some increasing shortness of breath which may be his anginal equivalent.  With his known coronary artery  disease, we will go ahead and check a pharmacologic stress test.  I will also check an echocardiogram since it has been several years.  Want to make sure that his pump function is normal.   He did show me that a coronary calcium score was ordered for him however, given his prior bare-metal stent, this is not a test that we would order.  He may go ahead and cancel.  On goal-directed medical therapy.  Continue with aspirin 81 mg,  Zetia 10 mg and atorvastatin 20 mg.  Checking lipid panel today.  Overall doing well.  We stopped his isosorbide back in March 2023 and he is feeling better.   Hypercholesterolemia Continue with atorvastatin and Zetia as above.  LDL 48 in 11/23. Excellent.    Dizziness Seems to be doing better with this after stopping the isosorbide.  Previously unusual.  Note that several years ago at [facility] he was noted to have visual obscurations, MRI was normal. 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  Neurology note reviewed. Continue with aspirin 81 mg.  CTA of neck also previously reviewed.   Lung nodules - Has seen pulmonary.  Continue to follow closely with them  Follow-up: 57-months  Medication Adjustments/Labs and Tests Ordered: Current medicines are reviewed at length with the patient today.  Concerns regarding medicines are outlined above.  Orders Placed This Encounter  Procedures   MYOCARDIAL PERFUSION IMAGING   EKG 12-Lead   No orders of the defined types were placed in this encounter.   Patient Instructions  Medication Instructions:  The current medical regimen is effective;  continue present plan and medications.  *If you need a refill on your cardiac medications before your next appointment, please call your pharmacy*  Testing/Procedures: Your physician has requested that you have a lexiscan myoview. For further information please visit [email or web address] Please follow instruction  sheet, as given.  Follow-Up: At [facility], you and your health needs are our priority.  As part of our continuing mission to provide you with exceptional heart care, we have created designated Provider Care Teams.  These Care Teams include your primary Cardiologist (physician) and Advanced Practice Providers (APPs -  Physician Assistants and Nurse Practitioners) who all work together to provide you with the care you need, when you need it.  We recommend signing up for the patient portal called "MyChart".  Sign up information is provided on this After Visit Summary.  MyChart is used to connect with patients for Virtual Visits (Telemedicine).  Patients are able to view lab/test results, encounter notes, upcoming appointments, etc.  Non-urgent messages can be sent to your provider as well.   To learn more about what you can do with MyChart, go to [email or web address].    Your next appointment:   1 year(s)  Provider:   Donato Schultz, MD         Signed, Donato Schultz, MD  09/27/2022 11:19 AM    [facility]

## 2022-09-27 NOTE — Patient Instructions (Signed)
Medication Instructions:  The current medical regimen is effective;  continue present plan and medications.  *If you need a refill on your cardiac medications before your next appointment, please call your pharmacy*  Testing/Procedures: Your physician has requested that you have a lexiscan myoview. For further information please visit [email or web address]. Please follow instruction sheet, as given.  Follow-Up: At [facility], you and your health needs are our priority.  As part of our continuing mission to provide you with exceptional heart care, we have created designated Provider Care Teams.  These Care Teams include your primary Cardiologist (physician) and Advanced Practice Providers (APPs -  Physician Assistants and Nurse Practitioners) who all work together to provide you with the care you need, when you need it.  We recommend signing up for the patient portal called "MyChart".  Sign up information is provided on this After Visit Summary.  MyChart is used to connect with patients for Virtual Visits (Telemedicine).  Patients are able to view lab/test results, encounter notes, upcoming appointments, etc.  Non-urgent messages can be sent to your provider as well.   To learn more about what you can do with MyChart, go to [email or web address].    Your next appointment:   1 year(s)  Provider:   Mark Skains, MD      

## 2022-09-30 ENCOUNTER — Telehealth (HOSPITAL_COMMUNITY): Payer: Self-pay | Admitting: *Deleted

## 2022-09-30 NOTE — Telephone Encounter (Signed)
Patient given detailed instructions per Myocardial Perfusion Study Information Sheet for the test on 10/02/22 Patient notified to arrive 15 minutes early and that it is imperative to arrive on time for appointment to keep from having the test rescheduled.  If you need to cancel or reschedule your appointment, please call the office within 24 hours of your appointment. . Patient verbalized understanding. Ricky Ala

## 2022-10-01 ENCOUNTER — Encounter (HOSPITAL_COMMUNITY): Payer: Medicare Other

## 2022-10-02 ENCOUNTER — Ambulatory Visit (HOSPITAL_COMMUNITY): Payer: Medicare Other | Attending: Internal Medicine

## 2022-10-02 DIAGNOSIS — R0602 Shortness of breath: Secondary | ICD-10-CM | POA: Insufficient documentation

## 2022-10-02 DIAGNOSIS — R072 Precordial pain: Secondary | ICD-10-CM | POA: Insufficient documentation

## 2022-10-02 LAB — MYOCARDIAL PERFUSION IMAGING
LV dias vol: 62 mL (ref 62–150)
LV sys vol: 28 mL
Nuc Stress EF: 54 %
Peak HR: 104 {beats}/min
Rest HR: 66 {beats}/min
Rest Nuclear Isotope Dose: 10.9 mCi
SDS: 0
SRS: 0
SSS: 0
ST Depression (mm): 0 mm
Stress Nuclear Isotope Dose: 32.4 mCi
TID: 0.91

## 2022-10-02 MED ORDER — REGADENOSON 0.4 MG/5ML IV SOLN
0.4000 mg | Freq: Once | INTRAVENOUS | Status: AC
  Administered 2022-10-02: 0.4 mg via INTRAVENOUS

## 2022-10-02 MED ORDER — TECHNETIUM TC 99M TETROFOSMIN IV KIT
10.9000 | PACK | Freq: Once | INTRAVENOUS | Status: AC | PRN
  Administered 2022-10-02: 10.9 via INTRAVENOUS

## 2022-10-02 MED ORDER — TECHNETIUM TC 99M TETROFOSMIN IV KIT
32.4000 | PACK | Freq: Once | INTRAVENOUS | Status: AC | PRN
  Administered 2022-10-02: 32.4 via INTRAVENOUS

## 2022-10-03 ENCOUNTER — Encounter (HOSPITAL_COMMUNITY): Payer: Self-pay

## 2022-10-03 ENCOUNTER — Other Ambulatory Visit (HOSPITAL_COMMUNITY): Payer: Medicare Other

## 2022-10-10 ENCOUNTER — Ambulatory Visit (HOSPITAL_COMMUNITY)
Admission: RE | Admit: 2022-10-10 | Discharge: 2022-10-10 | Disposition: A | Discharge status: Home / Self Care | Payer: Medicare Other | Source: Ambulatory Visit | Attending: Student | Admitting: Student

## 2022-10-10 DIAGNOSIS — I700 Atherosclerosis of aorta: Secondary | ICD-10-CM | POA: Diagnosis not present

## 2022-10-10 DIAGNOSIS — I2510 Atherosclerotic heart disease of native coronary artery without angina pectoris: Secondary | ICD-10-CM | POA: Diagnosis not present

## 2022-10-10 DIAGNOSIS — R0609 Other forms of dyspnea: Secondary | ICD-10-CM | POA: Insufficient documentation

## 2022-10-10 DIAGNOSIS — I272 Pulmonary hypertension, unspecified: Secondary | ICD-10-CM | POA: Diagnosis not present

## 2022-10-10 NOTE — Progress Notes (Signed)
Echocardiogram 2D Echocardiogram has been performed.  Augustine Radar 10/10/2022, 2:39 PM

## 2022-10-14 LAB — ECHOCARDIOGRAM COMPLETE
Area-P 1/2: 2.87 cm2
Calc EF: 51.6 %
S' Lateral: 3.1 cm
Single Plane A2C EF: 50.4 %
Single Plane A4C EF: 50.5 %

## 2022-10-28 ENCOUNTER — Telehealth: Payer: Self-pay | Admitting: Cardiology

## 2022-10-28 NOTE — Telephone Encounter (Signed)
Patient is requesting call back to go over echo results.  

## 2022-10-28 NOTE — Telephone Encounter (Signed)
Returned pts call and let him know that once the echo was read we would give him a call back.  Pt also needed help getting into his MyChart account. Tried to reset his password on our end but he stated he was still unable to log in. Was disconnected while looking for MyChart service desk number. Called pt back at [phone] and left message with service desk number.

## 2022-11-07 ENCOUNTER — Other Ambulatory Visit: Payer: Self-pay | Admitting: Cardiology

## 2022-12-25 ENCOUNTER — Ambulatory Visit
Admission: RE | Admit: 2022-12-25 | Discharge: 2022-12-25 | Disposition: A | Discharge status: Home / Self Care | Payer: Medicare Other | Source: Ambulatory Visit | Attending: Student | Admitting: Student

## 2022-12-25 DIAGNOSIS — J849 Interstitial pulmonary disease, unspecified: Secondary | ICD-10-CM

## 2022-12-25 DIAGNOSIS — I77810 Thoracic aortic ectasia: Secondary | ICD-10-CM | POA: Diagnosis not present

## 2022-12-25 DIAGNOSIS — R911 Solitary pulmonary nodule: Secondary | ICD-10-CM | POA: Diagnosis not present

## 2022-12-25 DIAGNOSIS — J8410 Pulmonary fibrosis, unspecified: Secondary | ICD-10-CM | POA: Diagnosis not present

## 2023-01-17 ENCOUNTER — Telehealth: Payer: Self-pay | Admitting: Student

## 2023-01-17 DIAGNOSIS — J849 Interstitial pulmonary disease, unspecified: Secondary | ICD-10-CM

## 2023-01-17 NOTE — Telephone Encounter (Signed)
I called the patient to discuss that CT shows stable lung nodules and some scarring of the lung.    Randy [name] will actually need an appointment made with Dr. Isaiah Serge or Dr. Marchelle Gearing as Randy [name] will need evaluation for interstitial lung disease.  Please order PFTs and new consult visit.

## 2023-01-17 NOTE — Telephone Encounter (Signed)
PT calling for CT results. Former Meier PT. Meier wanted Byrum or Icard to see him since he is a nod PT. I will try to sched PT but please call him with results. His # is [phone]

## 2023-01-22 NOTE — Telephone Encounter (Signed)
I also left msg for PT to call to resched w/PFT before Consult appt.

## 2023-01-29 ENCOUNTER — Institutional Professional Consult (permissible substitution): Payer: Medicare Other | Admitting: Pulmonary Disease

## 2023-02-10 DIAGNOSIS — C4490 Unspecified malignant neoplasm of skin, unspecified: Secondary | ICD-10-CM | POA: Diagnosis not present

## 2023-02-10 DIAGNOSIS — R911 Solitary pulmonary nodule: Secondary | ICD-10-CM | POA: Diagnosis not present

## 2023-02-10 DIAGNOSIS — I77819 Aortic ectasia, unspecified site: Secondary | ICD-10-CM | POA: Diagnosis not present

## 2023-02-10 DIAGNOSIS — I2510 Atherosclerotic heart disease of native coronary artery without angina pectoris: Secondary | ICD-10-CM | POA: Diagnosis not present

## 2023-02-10 DIAGNOSIS — E7800 Pure hypercholesterolemia, unspecified: Secondary | ICD-10-CM | POA: Diagnosis not present

## 2023-02-10 DIAGNOSIS — J309 Allergic rhinitis, unspecified: Secondary | ICD-10-CM | POA: Diagnosis not present

## 2023-02-10 DIAGNOSIS — E559 Vitamin D deficiency, unspecified: Secondary | ICD-10-CM | POA: Diagnosis not present

## 2023-02-10 DIAGNOSIS — J8410 Pulmonary fibrosis, unspecified: Secondary | ICD-10-CM | POA: Diagnosis not present

## 2023-02-10 DIAGNOSIS — I2721 Secondary pulmonary arterial hypertension: Secondary | ICD-10-CM | POA: Diagnosis not present

## 2023-02-10 LAB — LAB REPORT - SCANNED: EGFR: 88

## 2023-02-12 DIAGNOSIS — L570 Actinic keratosis: Secondary | ICD-10-CM | POA: Diagnosis not present

## 2023-02-12 DIAGNOSIS — Z85828 Personal history of other malignant neoplasm of skin: Secondary | ICD-10-CM | POA: Diagnosis not present

## 2023-02-12 DIAGNOSIS — D1801 Hemangioma of skin and subcutaneous tissue: Secondary | ICD-10-CM | POA: Diagnosis not present

## 2023-02-12 DIAGNOSIS — L820 Inflamed seborrheic keratosis: Secondary | ICD-10-CM | POA: Diagnosis not present

## 2023-02-12 DIAGNOSIS — L812 Freckles: Secondary | ICD-10-CM | POA: Diagnosis not present

## 2023-02-12 DIAGNOSIS — L821 Other seborrheic keratosis: Secondary | ICD-10-CM | POA: Diagnosis not present

## 2023-02-24 ENCOUNTER — Ambulatory Visit (HOSPITAL_BASED_OUTPATIENT_CLINIC_OR_DEPARTMENT_OTHER): Payer: Medicare Other | Admitting: Pulmonary Disease

## 2023-02-24 DIAGNOSIS — J849 Interstitial pulmonary disease, unspecified: Secondary | ICD-10-CM | POA: Diagnosis not present

## 2023-02-24 LAB — PULMONARY FUNCTION TEST
DL/VA % pred: 135 %
DL/VA: 5.31 ml/min/mmHg/L
DLCO cor % pred: 114 %
DLCO cor: 25.17 ml/min/mmHg
DLCO unc % pred: 114 %
DLCO unc: 25.17 ml/min/mmHg
FEF 25-75 Post: 5.72 L/s
FEF 25-75 Pre: 6.9 L/s
FEF2575-%Change-Post: -17 %
FEF2575-%Pred-Post: 346 %
FEF2575-%Pred-Pre: 417 %
FEV1-%Change-Post: 6 %
FEV1-%Pred-Post: 135 %
FEV1-%Pred-Pre: 126 %
FEV1-Post: 3.31 L
FEV1-Pre: 3.1 L
FEV1FVC-%Change-Post: 1 %
FEV1FVC-%Pred-Pre: 133 %
FEV6-%Change-Post: 5 %
FEV6-%Pred-Post: 105 %
FEV6-%Pred-Pre: 100 %
FEV6-Post: 3.42 L
FEV6-Pre: 3.26 L
FEV6FVC-%Pred-Post: 108 %
FEV6FVC-%Pred-Pre: 108 %
FVC-%Change-Post: 5 %
FVC-%Pred-Post: 97 %
FVC-%Pred-Pre: 93 %
FVC-Post: 3.42 L
FVC-Pre: 3.26 L
Post FEV1/FVC ratio: 97 %
Post FEV6/FVC ratio: 100 %
Pre FEV1/FVC ratio: 95 %
Pre FEV6/FVC Ratio: 100 %
RV % pred: 114 %
RV: 2.88 L
TLC % pred: 99 %
TLC: 6.45 L

## 2023-02-24 NOTE — Patient Instructions (Signed)
Full PFT Performed Today  

## 2023-02-24 NOTE — Progress Notes (Signed)
Full PFT Performed Today  

## 2023-03-17 ENCOUNTER — Ambulatory Visit (INDEPENDENT_AMBULATORY_CARE_PROVIDER_SITE_OTHER): Payer: Medicare Other | Admitting: Pulmonary Disease

## 2023-03-17 ENCOUNTER — Encounter: Payer: Self-pay | Admitting: Pulmonary Disease

## 2023-03-17 VITALS — BP 122/68 | HR 62 | Ht 67.0 in | Wt 168.0 lb

## 2023-03-17 DIAGNOSIS — J849 Interstitial pulmonary disease, unspecified: Secondary | ICD-10-CM | POA: Diagnosis not present

## 2023-03-17 LAB — CBC WITH DIFFERENTIAL/PLATELET
Basophils Absolute: 0 10*3/uL (ref 0.0–0.1)
Basophils Relative: 0.6 % (ref 0.0–3.0)
Eosinophils Absolute: 0.1 10*3/uL (ref 0.0–0.7)
Eosinophils Relative: 2.2 % (ref 0.0–5.0)
HCT: 42.3 % (ref 39.0–52.0)
Hemoglobin: 14 g/dL (ref 13.0–17.0)
Lymphocytes Relative: 19 % (ref 12.0–46.0)
Lymphs Abs: 0.9 10*3/uL (ref 0.7–4.0)
MCHC: 33 g/dL (ref 30.0–36.0)
MCV: 97.6 fL (ref 78.0–100.0)
Monocytes Absolute: 0.6 10*3/uL (ref 0.1–1.0)
Monocytes Relative: 14 % — ABNORMAL HIGH (ref 3.0–12.0)
Neutro Abs: 2.9 10*3/uL (ref 1.4–7.7)
Neutrophils Relative %: 64.2 % (ref 43.0–77.0)
Platelets: 158 10*3/uL (ref 150.0–400.0)
RBC: 4.33 Mil/uL (ref 4.22–5.81)
RDW: 13.6 % (ref 11.5–15.5)
WBC: 4.5 10*3/uL (ref 4.0–10.5)

## 2023-03-17 LAB — COMPREHENSIVE METABOLIC PANEL
ALT: 22 U/L (ref 0–53)
AST: 27 U/L (ref 0–37)
Albumin: 3.9 g/dL (ref 3.5–5.2)
Alkaline Phosphatase: 73 U/L (ref 39–117)
BUN: 12 mg/dL (ref 6–23)
CO2: 30 meq/L (ref 19–32)
Calcium: 9.2 mg/dL (ref 8.4–10.5)
Chloride: 97 meq/L (ref 96–112)
Creatinine, Ser: 0.76 mg/dL (ref 0.40–1.50)
GFR: 84.32 mL/min (ref >60.00)
Glucose, Bld: 88 mg/dL (ref 70–99)
Potassium: 4.3 meq/L (ref 3.5–5.1)
Sodium: 131 meq/L — ABNORMAL LOW (ref 135–145)
Total Bilirubin: 0.5 mg/dL (ref 0.2–1.2)
Total Protein: 6.6 g/dL (ref 6.0–8.3)

## 2023-03-17 NOTE — Progress Notes (Signed)
Randy [name]    [id number]    02-06-1941  Primary Care Physician:Hammer, Theone Murdoch, MD  Referring Physician: Clovis Riley, Elbert Ewings.August Saucer, MD [address] [place],  [place] [address]  Chief complaint: Follow-up for lung nodule, interstitial lung disease  HPI: 82 y.o. who  has a past medical history of Arthritis, CAD (coronary artery disease), Hypercholesterolemia, Melanoma (HCC) (2009), and Rosacea.   Discussed the use of AI scribe software for clinical note transcription with the patient, who gave verbal consent to proceed.  The patient, with a past medical history of coronary artery disease, melanoma, and lung nodules, presents for a follow-up visit regarding lung nodules. The patient reports no current symptoms related to the lungs, such as shortness of breath or cough. The patient has a history of smoking but quit about 50 years ago. The patient's sister had pulmonary fibrosis. The patient has been found to have some scarring in the lungs, more on the right than the left. The patient reports no exposure to chemicals, mining, or other potential lung irritants. The patient does report a history of pneumonia in the 1980s but was not hospitalized for it. The patient also reports having acid reflux.   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  No feather pillows or comforters ILD questionnaire 03/17/2023-negative Smoking history: 30-pack-year smoker.  Quit in 1989 Travel history: No significant travel history Relevant family history: Patient's sister had unspecified lung issues and possibly pulmonary fibrosis.  Outpatient Encounter Medications as of 03/17/2023  Medication Sig   aspirin EC 81 MG tablet Take 1 tablet (81 mg total) by mouth daily. Swallow whole.   atorvastatin (LIPITOR) 20 MG tablet Take 1 tablet (20 mg total) by mouth daily.   docusate sodium (COLACE) 100 MG capsule Take 100 mg by mouth 2  (two) times daily.   Ergocalciferol (VITAMIN D2) 50 MCG (2000 UT) TABS Take by mouth.   ezetimibe (ZETIA) 10 MG tablet Take 1 tablet (10 mg total) by mouth daily.   meclizine (ANTIVERT) 25 MG tablet 25 mg daily.   meloxicam (MOBIC) 15 MG tablet Take 15 mg by mouth daily as needed.   nitroGLYCERIN (NITROSTAT) 0.4 MG SL tablet PLACE 1 TAB UNDER TONGUE EVERY 5 MINS, UP TO 3 DOSES AS NEEDED FOR CHEST PAIN- CALL 911. KEEP APPT   Facility-Administered Encounter Medications as of 03/17/2023  Medication   0.9 %  sodium chloride infusion    Allergies as of 03/17/2023   (No Known Allergies)    Past Medical History:  Diagnosis Date   Arthritis    CAD (coronary artery disease)    Hypercholesterolemia    Melanoma (HCC) 2009   melanoma head   Rosacea     Past Surgical History:  Procedure Laterality Date   COLONOSCOPY     CORONARY ANGIOPLASTY WITH STENT PLACEMENT  2000   left temporal melanoma     TONSILLECTOMY      Family History  Problem Relation Age of Onset   Stomach cancer Mother    Heart attack Father        Deceased, 40   Diabetes Sister    Heart attack Sister    Multiple sclerosis Son    Healthy Daughter    Colon cancer Neg Hx    Esophageal cancer Neg Hx     Social History   Socioeconomic History   Marital status: Married    Spouse  name: Not on file   Number of children: Not on file   Years of education: Not on file   Highest education level: Not on file  Occupational History   Not on file  Tobacco Use   Smoking status: Former    Current packs/day: 0.00    Average packs/day: 2.0 packs/day for 14.0 years (27.9 ttl pk-yrs)    Types: Cigarettes    Start date: 09/17/1957    Quit date: 06/21/1971    Years since quitting: 51.7   Smokeless tobacco: Never   Tobacco comments:    patient had quit for about 32yrs ago  Vaping Use   Vaping status: Never Used  Substance and Sexual Activity   Alcohol use: No    Alcohol/week: 0.0 standard drinks of alcohol   Drug use: No    Sexual activity: Not on file  Other Topics Concern   Not on file  Social History Narrative   Lives with wife in a one story home.  Has 2 children.     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    Education: high school      Right Handed    Social Determinants of Health   Financial Resource Strain: Not on file  Food Insecurity: Not on file  Transportation Needs: Not on file  Physical Activity: Not on file  Stress: Not on file  Social Connections: Not on file  Intimate Partner Violence: Not on file    Review of systems: Review of Systems  Constitutional: Negative for fever and chills.  HENT: Negative.   Eyes: Negative for blurred vision.  Respiratory: as per HPI  Cardiovascular: Negative for chest pain and palpitations.  Gastrointestinal: Negative for vomiting, diarrhea, blood per rectum. Genitourinary: Negative for dysuria, urgency, frequency and hematuria.  Musculoskeletal: Negative for myalgias, back pain and joint pain.  Skin: Negative for itching and rash.  Neurological: Negative for dizziness, tremors, focal weakness, seizures and loss of consciousness.  Endo/Heme/Allergies: Negative for environmental allergies.  Psychiatric/Behavioral: Negative for depression, suicidal ideas and hallucinations.  All other systems reviewed and are negative.  Physical Exam: Blood pressure 122/68, pulse 62, height 5\' 7"  (1.702 m), weight 168 lb (76.2 kg), SpO2 97%. Gen:      No acute distress HEENT:  EOMI, sclera anicteric Neck:     No masses; no thyromegaly Lungs:    Clear to auscultation bilaterally; normal respiratory effort CV:         Regular rate and rhythm; no murmurs Abd:      + bowel sounds; soft, non-tender; no palpable masses, no distension Ext:    No edema; adequate peripheral perfusion Skin:      Warm and dry; no rash Neuro: alert and oriented x 3 Psych: normal mood and affect  Data Reviewed: Imaging: High  resolution CT 12/25/2022- Interstitial lung disease right greater than left and probable UIP pattern.  Stable pulmonary nodule measuring 6 mm, thoracic aorta 4.2 cm, aortic, coronary atherosclerosis. I have reviewed the images personally.  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 No significant occupational or environmental exposures identified. Mild exertional dyspnea noted. Normal lung function tests. Family history of pulmonary fibrosis in sister.  We had detailed discussion today in clinic about possible etiologies and workup plans.  -Order labs to rule out  autoimmune causes of ILD. -Discuss case in upcoming multidisciplinary conference for further input on diagnosis. -Consider initiation of antifibrotic therapy depending on conference consensus and patient's preference, understanding the potential side effects including gastrointestinal upset and liver function abnormalities. -Plan follow-up in 2-3 months to discuss diagnosis and potential treatment options.  Lung Nodules Stable, likely benign lung nodules identified on CT chest. -Continue annual CT chest surveillance to monitor nodules.  Coronary Artery Disease History of coronary artery disease with stent placement in 2000. -Continue current management.  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 -Continue current management.   Recommendations: CTD serologies Discussion at multidisciplinary conference.  Chilton Greathouse MD [facility] 03/17/2023, 9:03 AM  CC: Clovis Riley, L.August Saucer, MD

## 2023-03-17 NOTE — Patient Instructions (Signed)
VISIT SUMMARY:  You came in today for a follow-up visit regarding your lung nodules. You reported no current symptoms related to your lungs, such as shortness of breath or cough. We discussed your history of smoking, which you quit about 50 years ago, and your family history of pulmonary fibrosis. We also reviewed your past medical history, including coronary artery disease and melanoma.  YOUR PLAN:  -INTERSTITIAL LUNG DISEASE (ILD): Interstitial Lung Disease (ILD) is a group of lung disorders that cause scarring of lung tissues, which can affect your ability to breathe. 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 We will order labs to rule out autoimmune causes and discuss your case in an upcoming multidisciplinary conference. Depending on the conference's input and your preference, we may consider starting antifibrotic therapy. We will follow up in 2-3 months to discuss the diagnosis and potential treatment options.  -LUNG NODULES: Lung nodules are small masses of tissue in the lung that are usually benign. Your CT chest scan showed stable, likely benign lung nodules. We will continue with annual CT chest surveillance to monitor these nodules.  -CORONARY ARTERY DISEASE: Coronary artery disease is a condition where the blood vessels supplying the heart are narrowed or blocked. You have a history of this condition and had a stent placed in 2000. We will continue with your current management plan.  -MELANOMA: Melanoma is a type of skin cancer. You had melanoma on your right ear, which was removed about a year ago. We will continue with your current management plan.  INSTRUCTIONS:  We will follow up in 2-3 months to discuss the diagnosis and potential treatment options for your Interstitial Lung Disease (ILD). Continue with annual CT chest surveillance for your lung nodules. Maintain your current management plan for coronary artery disease and melanoma.

## 2023-03-19 LAB — ANA,IFA RA DIAG PNL W/RFLX TIT/PATN
Anti Nuclear Antibody (ANA): NEGATIVE
Cyclic Citrullin Peptide Ab: <16 U
Rheumatoid fact SerPl-aCnc: <10 [IU]/mL (ref <14)

## 2023-03-19 LAB — ANTI-DNA ANTIBODY, DOUBLE-STRANDED: ds DNA Ab: 1 [IU]/mL

## 2023-03-26 DIAGNOSIS — H2511 Age-related nuclear cataract, right eye: Secondary | ICD-10-CM | POA: Diagnosis not present

## 2023-03-26 DIAGNOSIS — H52203 Unspecified astigmatism, bilateral: Secondary | ICD-10-CM | POA: Diagnosis not present

## 2023-04-01 ENCOUNTER — Encounter (INDEPENDENT_AMBULATORY_CARE_PROVIDER_SITE_OTHER): Payer: Self-pay | Admitting: Pulmonary Disease

## 2023-04-01 DIAGNOSIS — J849 Interstitial pulmonary disease, unspecified: Secondary | ICD-10-CM | POA: Diagnosis not present

## 2023-04-11 ENCOUNTER — Telehealth: Payer: Self-pay | Admitting: Pulmonary Disease

## 2023-04-11 NOTE — Telephone Encounter (Signed)
Disregard

## 2023-04-21 ENCOUNTER — Ambulatory Visit: Payer: Medicare Other | Admitting: Pulmonary Disease

## 2023-05-29 DIAGNOSIS — U071 COVID-19: Secondary | ICD-10-CM | POA: Diagnosis not present

## 2023-05-29 DIAGNOSIS — R197 Diarrhea, unspecified: Secondary | ICD-10-CM | POA: Diagnosis not present

## 2023-05-29 DIAGNOSIS — R059 Cough, unspecified: Secondary | ICD-10-CM | POA: Diagnosis not present

## 2023-06-11 ENCOUNTER — Encounter: Payer: Medicare Other | Admitting: Pulmonary Disease

## 2023-06-11 NOTE — Progress Notes (Signed)
   Interstitial Lung Disease Multidisciplinary Conference   Randy [name]    MRN [id number]    DOB 07-07-1940  Primary Care Physician:Hammer, Theone Murdoch, MD  Referring Physician: Dr. Chilton Greathouse MD  Time of Conference: 7.30am- 8.30am Date of conference: 04/01/2023 Location of Conference: -  Virtual  Participating Pulmonary: Dr. Kalman Shan, MD,  Dr Chilton Greathouse, MD Pathology:  Radiology: Dr Cleone Slim MD Others:   Brief History:  Mild ILD identified on CT chest, possibly idiopathic pulmonary fibrosis (IPF). No significant occupational or environmental exposures were identified. Mild exertional dyspnea noted. Normal lung function tests. Family history of pulmonary fibrosis in sister.   Please review CT scan for pattern on ILD and progression  PFT    Latest Ref Rng & Units 02/24/2023    1:49 PM  PFT Results  FVC-Pre L 3.26   FVC-Predicted Pre % 93   FVC-Post L 3.42   FVC-Predicted Post % 97   Pre FEV1/FVC % % 95   Post FEV1/FCV % % 97   FEV1-Pre L 3.10   FEV1-Predicted Pre % 126   FEV1-Post L 3.31   DLCO uncorrected ml/min/mmHg 25.17   DLCO UNC% % 114   DLCO corrected ml/min/mmHg 25.17   DLCO COR %Predicted % 114   DLVA Predicted % 135   TLC L 6.45   TLC % Predicted % 99   RV % Predicted % 114       MDD discussion of CT scan   High resolution CT 12/25/2022 Patchy asymmetric groundglass, septal thickening with subpleural reticulation, peripheral bronchiectasis and probable UIP pattern  MDD Impression/Recs:  Asymmetric appearance but based on ATS criteria with probable UIP. Familial ILDs can have an odd appearance. No progression from scan in April of 2025  Time Spent in preparation and discussion:  > 30 min  SIGNATURE   Chilton Greathouse MD [facility] See Amion for pager  If no response to pager , please call [phone] until 7pm After 7:00 pm call Elink  [phone] 06/11/2023, 1:28 PM

## 2023-06-11 NOTE — Progress Notes (Signed)
 This encounter was created in error - please disregard.

## 2023-06-12 ENCOUNTER — Encounter: Payer: Self-pay | Admitting: Pulmonary Disease

## 2023-06-12 ENCOUNTER — Ambulatory Visit: Payer: Medicare Other | Admitting: Pulmonary Disease

## 2023-06-12 VITALS — BP 129/82 | HR 73 | Ht 69.0 in | Wt 166.8 lb

## 2023-06-12 DIAGNOSIS — R918 Other nonspecific abnormal finding of lung field: Secondary | ICD-10-CM

## 2023-06-12 DIAGNOSIS — Z5181 Encounter for therapeutic drug level monitoring: Secondary | ICD-10-CM | POA: Diagnosis not present

## 2023-06-12 DIAGNOSIS — J849 Interstitial pulmonary disease, unspecified: Secondary | ICD-10-CM | POA: Diagnosis not present

## 2023-06-12 NOTE — Progress Notes (Signed)
Randy [name]    [id number]    07/15/1940  Primary Care Physician:Hammer, Theone Murdoch, MD  Referring Physician: Irven Coe, MD [address] [place],  [place] [address]  Chief complaint: Follow-up for lung nodule, interstitial lung disease  HPI: 83 y.o. who  has a past medical history of Arthritis, CAD (coronary artery disease), Hypercholesterolemia, Melanoma (HCC) (2009), and Rosacea.   Discussed the use of AI scribe software for clinical note transcription with the patient, who gave verbal consent to proceed.  The patient, with a past medical history of coronary artery disease, melanoma, and lung nodules, presents for a follow-up visit regarding lung nodules. The patient reports no current symptoms related to the lungs, such as shortness of breath or cough. The patient has a history of smoking but quit about 50 years ago. The patient's sister had pulmonary fibrosis. The patient has been found to have some scarring in the lungs, more on the right than the left. The patient reports no exposure to chemicals, mining, or other potential lung irritants. The patient does report a history of pneumonia in the 1980s but was not hospitalized for it. The patient also reports having acid reflux.   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  No feather pillows or comforters ILD questionnaire 03/17/2023-negative Smoking history: 30-pack-year smoker.  Quit in 1989 Travel history: No significant travel history Relevant family history: Patient's sister had unspecified lung issues and possibly pulmonary fibrosis.  Interim history: Discussed the use of AI scribe software for clinical note transcription with the patient, who gave verbal consent to proceed.  The patient presents for evaluation of an abnormal CT scan revealing pulmonary fibrosis or interstitial lung disease. He recently recovered from COVID-19 in  early January 2024, experiencing severe headaches, diarrhea, and a sore throat, but no fever. He was treated with Paxlovid. He denies any known exposures to substances like asbestos that could cause lung disease. He has a family history of severe asthma and heart problems. His sister also had breathing problems and was obese. The patient's labs were negative for autoimmune diseases like lupus and rheumatoid arthritis.   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  Outpatient Encounter Medications as of 06/12/2023  Medication Sig   aspirin EC 81 MG tablet Take 1 tablet (81 mg total) by mouth daily. Swallow whole.   atorvastatin (LIPITOR) 20 MG tablet Take 1 tablet (20 mg total) by mouth daily.   docusate sodium (COLACE) 100 MG capsule Take 100 mg by mouth 2 (two) times daily.   Ergocalciferol (VITAMIN D2) 50 MCG (2000 UT) TABS Take by mouth.   ezetimibe (ZETIA) 10 MG tablet Take 1 tablet (10 mg total) by mouth daily.   meclizine (ANTIVERT) 25 MG tablet 25 mg daily.   meloxicam (MOBIC) 15 MG tablet Take 15 mg by mouth daily as needed.   nitroGLYCERIN (NITROSTAT) 0.4 MG SL tablet PLACE 1 TAB UNDER TONGUE EVERY 5 MINS, UP TO 3 DOSES AS NEEDED FOR CHEST PAIN- CALL 911. KEEP APPT   Facility-Administered Encounter Medications as of 06/12/2023  Medication   0.9 %  sodium chloride infusion    Physical Exam: Blood pressure 129/82, pulse 73, height 5\' 9"  (1.753 m), weight 166 lb 12.8 oz (75.7 kg), SpO2 96%. Gen:      No acute distress HEENT:  EOMI, sclera anicteric Neck:  No masses; no thyromegaly Lungs:    Clear to auscultation bilaterally; normal respiratory effort CV:         Regular rate and rhythm; no murmurs Abd:      + bowel sounds; soft, non-tender; no palpable masses, no distension Ext:    No edema; adequate peripheral perfusion Skin:      Warm and dry; no rash Neuro: alert and oriented x 3 Psych: normal mood and affect    Data Reviewed: Imaging: High resolution CT 12/25/2022- Interstitial lung disease right greater than left and probable UIP pattern.  Stable pulmonary nodule measuring 6 mm, thoracic aorta 4.2 cm, aortic, coronary atherosclerosis. I have reviewed the images personally.  PFTs: 02/27/2023 FVC 3.42 [97%], FEV1 3.31 [135%], F/F97, TLC 6.45 [99%], DLCO 25.17 [114%] Normal test  Labs: CTD serologies 03/17/2023-negative  Assessment and Plan Idiopathic Pulmonary Fibrosis (IPF) Discussed at multidisciplinary conference January 2025.  No significant exposures, no signs of connective tissue disease, CTD serologies are negative with probable UIP pattern on CT  Stable on recent CT scans 4 months apart in 2024, but potential for progressive disease. No current dyspnea. Discussed the risks/benefits of starting medication to slow disease progression (potential side effects including diarrhea, stomach upset, weight loss, fatigue, and liver monitoring).  -Start two times a day Ofev for IPF.  Recent LFTs are normal -Communicate any side effects experienced for potential dose adjustment or additional medication to manage side effects. -Apply for patient assistance for medication cost. -Next CT scan in one year to monitor disease progression.  COVID-19 Recovered with treatment of Paxlovid. Presented with severe headaches, diarrhea, and sore throat. No fever or respiratory symptoms. -No further action required at this time.  Lung Nodules Stable, likely benign lung nodules identified on CT chest. -Continue annual CT chest surveillance to monitor nodules.  Coronary Artery Disease History of coronary artery disease with stent placement in 2000. -Continue current management.  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 -Continue current management.   Follow-up in three months.   Recommendations: Start Ofev  Chilton Greathouse MD [facility] 06/12/2023, 10:08 AM  CC: Irven Coe, MD

## 2023-06-12 NOTE — Patient Instructions (Signed)
VISIT SUMMARY:  You came in today to discuss your recent abnormal CT scan, which showed signs of pulmonary fibrosis, a type of lung disease. We also reviewed your recent recovery from COVID-19.  YOUR PLAN:  -IDIOPATHIC PULMONARY FIBROSIS (IPF): Idiopathic Pulmonary Fibrosis (IPF) is a lung disease that causes scarring of the lung tissue, making it difficult to breathe over time. Your recent CT scans show that your condition is stable, but it can progress. We discussed starting a medication to slow the disease progression, which you will take twice a day. Be aware of potential side effects like diarrhea, stomach upset, weight loss, fatigue, and the need for liver monitoring. Please let us know if you experience any side effects so we can adjust your dose or provide additional medication to manage them. We will also apply for patient assistance to help with the medication cost. Your next CT scan will be in one year to monitor the disease.  -COVID-19: You have recovered from COVID-19 after being treated with Paxlovid. You experienced severe headaches, diarrhea, and a sore throat, but no fever or respiratory symptoms. No further action is required at this time.  INSTRUCTIONS:  Please follow up in three months. Your next CT scan will be in one year to monitor the progression of your lung disease.

## 2023-06-20 ENCOUNTER — Telehealth: Payer: Self-pay

## 2023-06-20 DIAGNOSIS — J849 Interstitial pulmonary disease, unspecified: Secondary | ICD-10-CM

## 2023-06-20 DIAGNOSIS — Z5181 Encounter for therapeutic drug level monitoring: Secondary | ICD-10-CM

## 2023-06-20 NOTE — Telephone Encounter (Signed)
Received New start paperwork for OFEV. Will update as we work through the benefits process.  Submitted a Prior Authorization request to [facility] for OFEV via CoverMyMeds. Will update once we receive a response.  Key: [id number]

## 2023-06-23 ENCOUNTER — Other Ambulatory Visit (HOSPITAL_COMMUNITY): Payer: Self-pay

## 2023-06-23 NOTE — Telephone Encounter (Signed)
Received notification from [facility] regarding a prior authorization for OFEV. Authorization has been APPROVED from 06/20/2023 to 06/19/2024. Approval letter sent to scan center.  Per test claim, copay for 30 days supply is $1,980.03  Patient can fill through [facility]: [phone]   Authorization # [id number]  Called and spoke to pt's daughter Luster Landsberg and went over relevant information. Discussed new Medicare Maximum OOP for the year and explained the option of the Medicare Payment Plan if pt is not comfortable paying copay outright. Explained that [facility] will be reaching out to pt to go over clinical information and will discuss next steps regarding how to receive the medication at that time. Daughter verbalized understanding to all and will speak to her father regarding this information ahead of time.

## 2023-06-23 NOTE — Telephone Encounter (Signed)
Patient received an approval letter for [facility] and is wondering when he can place the order to his pharmacy. Please call and advise. Daughter is on [place].

## 2023-06-26 ENCOUNTER — Other Ambulatory Visit: Payer: Self-pay | Admitting: Cardiology

## 2023-06-27 NOTE — Telephone Encounter (Signed)
 Daughter would like for her father to be called on his cell phone.Please call patient at [phone]

## 2023-07-02 MED ORDER — OFEV 150 MG PO CAPS: 150.0000 mg | ORAL_CAPSULE | Freq: Two times a day (BID) | ORAL | 1 refills | Status: DC

## 2023-07-02 NOTE — Telephone Encounter (Signed)
Enrolled patient into PAF grant for copay:  Award Period: 01/03/2023 - 07/01/2024 ID: [id number] BIN: [id number] PCN: [id number] Group: [id number] For pharmacy inquiries, contact PDMI at [phone].

## 2023-07-02 NOTE — Telephone Encounter (Signed)
PT daughter calling again. Very nice but can not understand why no call back as directed below. Expectation was set she would call back pretty quickly. Adv Ms. Devki comes in at noon and I will send back a high priority message to her. TY.

## 2023-07-02 NOTE — Telephone Encounter (Signed)
Spoke with patient regarding Ofev. We cannot fill Ofev through [facility]. [facility] can [facility] (pulmonary fibrosis team): [phone]. [facility] to [facility] today. MyChart message sent to patient with phone number and grant informtion  Reviewed diarrhea as side effect, management, diet modification prn. Reviewed lab monitoring: LFTs monthly x 3 motnhs then every 3 months

## 2023-07-17 ENCOUNTER — Telehealth: Payer: Self-pay | Admitting: Cardiology

## 2023-07-17 NOTE — Telephone Encounter (Signed)
 Spoke with patient and he is aware he can take OFEV with his other medications

## 2023-07-17 NOTE — Telephone Encounter (Signed)
 Pt c/o medication issue:  1. Name of Medication:   Nintedanib (OFEV) 150 MG CAPS   2. How are you currently taking this medication (dosage and times per day)?     3. Are you having a reaction (difficulty breathing--STAT)?   4. What is your medication issue?   Patient stated he took his first dose of this medication yesterday evening and wants to know if he can take this medication at the same time as his morning medications atorvastatin (LIPITOR) 20 MG tablet, aspirin and ezetimibe (ZETIA) 10 MG tablet.

## 2023-07-17 NOTE — Telephone Encounter (Signed)
 Yes, it it ok to take OFEV with his other medications at the same time

## 2023-07-24 ENCOUNTER — Telehealth: Payer: Self-pay | Admitting: Pharmacist

## 2023-07-24 DIAGNOSIS — Z5181 Encounter for therapeutic drug level monitoring: Secondary | ICD-10-CM

## 2023-07-24 DIAGNOSIS — J849 Interstitial pulmonary disease, unspecified: Secondary | ICD-10-CM

## 2023-07-25 NOTE — Telephone Encounter (Signed)
 Patient's daughter stated that her father started Ofev a week ago and they wanted clarification regarding labwork. I advised that appt is not needed for bloodwork. However recommended they call clinic to ensure our phlebotomist is onsite before making drive to clinic.  She verbalized understanding.  Chesley Mires, PharmD, MPH, BCPS, CPP Clinical Pharmacist (Rheumatology and Pulmonology)

## 2023-08-01 ENCOUNTER — Telehealth: Payer: Self-pay | Admitting: Pulmonary Disease

## 2023-08-01 NOTE — Telephone Encounter (Signed)
 Daughter (DPR) calling wishing to speak to a nurse about the side effects of this Pt's medications.   [phone]  Durel Salts is the medication

## 2023-08-01 NOTE — Telephone Encounter (Signed)
 Started Ofev 2 weeks ago. He is now having diarrhea. He is taking with a meal. Its so bad this am that he hasn't taken his medicine. He does have immodium. Is it ok if he skips this med today? Is there anything stronger than immodium? It seems to be an all day thing with the diarrhea. They are aware that it is a side effect. Does he have to learn to maintain diarrhea and live with it or do they need to change medicine?

## 2023-08-01 NOTE — Telephone Encounter (Signed)
 Sometimes it can get better but have him hold it for a week then resume 1 tab once a day for a week then increase back to twice daily dosing. He can use the imodium to treat the diarrhea. Monitor response once resuming. Push fluids and stay hydrated. He should also try increasing fiber intake as this can help bulk up the stool. Thanks.

## 2023-08-01 NOTE — Telephone Encounter (Signed)
 PT ret call. She thought she would get a call back today. They have moire questions like can he take something stronger than Imodium. Her # is [phone]

## 2023-08-04 ENCOUNTER — Telehealth: Payer: Self-pay | Admitting: Pulmonary Disease

## 2023-08-04 ENCOUNTER — Telehealth: Payer: Self-pay | Admitting: Pharmacist

## 2023-08-04 NOTE — Telephone Encounter (Signed)
 Patient's daughter requested call regarding dietary changes that patient may need to make. He specifically enjoys eating egg, cereal with milk, apples, peaches.   Reviewed that foods that are most culpable of diarrhea are spicy foods, processed foods, low-fiber, dairy, caffeine. Reviewed some zero-sugar options for Gatorade if needed for electrolyte repletion with diarrhea. Reviewed option of switching to non-dairy milks (oat, almond) though he states that he only drinks a little milk.  They are interested in enrolling into [employer]. She is helping to enroll patient into program and will fax paperwork to [employer] for [name] to complete enrollment for patient  Randy [name], PharmD, MPH, BCPS, CPP Clinical Pharmacist (Rheumatology and Pulmonology)

## 2023-08-04 NOTE — Telephone Encounter (Signed)
 This paperwork is for [employer] enrollment to assist with side effect management. Duaghter provided with pharmacy team's fax number

## 2023-08-04 NOTE — Telephone Encounter (Signed)
 Daughter is working on W[employer]/r[employer] on behalf of father and will fax over the patient and provider portions for further completion.

## 2023-08-08 DIAGNOSIS — E559 Vitamin D deficiency, unspecified: Secondary | ICD-10-CM | POA: Diagnosis not present

## 2023-08-08 DIAGNOSIS — I2510 Atherosclerotic heart disease of native coronary artery without angina pectoris: Secondary | ICD-10-CM | POA: Diagnosis not present

## 2023-08-12 DIAGNOSIS — E7800 Pure hypercholesterolemia, unspecified: Secondary | ICD-10-CM | POA: Diagnosis not present

## 2023-08-12 DIAGNOSIS — I77819 Aortic ectasia, unspecified site: Secondary | ICD-10-CM | POA: Diagnosis not present

## 2023-08-12 DIAGNOSIS — R911 Solitary pulmonary nodule: Secondary | ICD-10-CM | POA: Diagnosis not present

## 2023-08-12 DIAGNOSIS — I2721 Secondary pulmonary arterial hypertension: Secondary | ICD-10-CM | POA: Diagnosis not present

## 2023-08-12 DIAGNOSIS — I2510 Atherosclerotic heart disease of native coronary artery without angina pectoris: Secondary | ICD-10-CM | POA: Diagnosis not present

## 2023-08-12 DIAGNOSIS — G629 Polyneuropathy, unspecified: Secondary | ICD-10-CM | POA: Diagnosis not present

## 2023-08-12 DIAGNOSIS — Z0000 Encounter for general adult medical examination without abnormal findings: Secondary | ICD-10-CM | POA: Diagnosis not present

## 2023-08-12 DIAGNOSIS — M545 Low back pain, unspecified: Secondary | ICD-10-CM | POA: Diagnosis not present

## 2023-08-12 DIAGNOSIS — E559 Vitamin D deficiency, unspecified: Secondary | ICD-10-CM | POA: Diagnosis not present

## 2023-08-12 DIAGNOSIS — J984 Other disorders of lung: Secondary | ICD-10-CM | POA: Diagnosis not present

## 2023-08-12 DIAGNOSIS — J8410 Pulmonary fibrosis, unspecified: Secondary | ICD-10-CM | POA: Diagnosis not present

## 2023-08-13 DIAGNOSIS — Z85828 Personal history of other malignant neoplasm of skin: Secondary | ICD-10-CM | POA: Diagnosis not present

## 2023-08-13 DIAGNOSIS — L570 Actinic keratosis: Secondary | ICD-10-CM | POA: Diagnosis not present

## 2023-08-13 DIAGNOSIS — L820 Inflamed seborrheic keratosis: Secondary | ICD-10-CM | POA: Diagnosis not present

## 2023-08-13 DIAGNOSIS — D1801 Hemangioma of skin and subcutaneous tissue: Secondary | ICD-10-CM | POA: Diagnosis not present

## 2023-08-13 DIAGNOSIS — L821 Other seborrheic keratosis: Secondary | ICD-10-CM | POA: Diagnosis not present

## 2023-08-13 DIAGNOSIS — Z85820 Personal history of malignant melanoma of skin: Secondary | ICD-10-CM | POA: Diagnosis not present

## 2023-08-13 NOTE — Telephone Encounter (Signed)
 NFN. Has been seen

## 2023-08-15 ENCOUNTER — Telehealth: Payer: Self-pay

## 2023-08-15 NOTE — Telephone Encounter (Signed)
 Copied from CRM [id number]. Topic: Clinical - Lab/Test Results >> Aug 15, 2023 10:09 AM Renie Ora wrote: Reason for CRM: Patient daughter Helayne Seminole [phone] called in regarding bloodwork and labs. The patient recently had labs done and was calling to ensure the results were received and sent over from Dr.Eli Hammer at [facility]. Renee also would like to know when Dr.Mannam would like for the patient to have more bloodwork test done for his liver.  Spoke with pts daughter Luster Landsberg (DPR) Pt had labs at [facility] 3/21 and is concerned about the results. Labs are in Care Everywhere. Daughter concerned when pt needs next blood work since he is taking Ofev. Please advise Dr. Isaiah Serge

## 2023-08-18 ENCOUNTER — Other Ambulatory Visit: Payer: Self-pay | Admitting: Cardiology

## 2023-08-19 ENCOUNTER — Other Ambulatory Visit: Payer: Self-pay | Admitting: Family Medicine

## 2023-08-19 DIAGNOSIS — I77819 Aortic ectasia, unspecified site: Secondary | ICD-10-CM

## 2023-08-20 NOTE — Telephone Encounter (Signed)
 I have reviewed the blood tests and they appear normal.  Continue therapy with Ofev as prescribed He will need the liver panel to be repeated every month for the next 6 months.  We can either order it or he can continue to get it done at [facility] physicians office.

## 2023-08-20 NOTE — Telephone Encounter (Signed)
 Pt is aware of below results and voiced his understanding.  He would like to have PCP order monthly lab. Nothing further needed.

## 2023-09-08 ENCOUNTER — Encounter: Payer: Self-pay | Admitting: Pulmonary Disease

## 2023-09-08 ENCOUNTER — Other Ambulatory Visit

## 2023-09-08 DIAGNOSIS — J849 Interstitial pulmonary disease, unspecified: Secondary | ICD-10-CM

## 2023-09-08 DIAGNOSIS — Z5181 Encounter for therapeutic drug level monitoring: Secondary | ICD-10-CM

## 2023-09-08 LAB — COMPREHENSIVE METABOLIC PANEL WITH GFR
ALT: 13 U/L (ref 0–53)
AST: 24 U/L (ref 0–37)
Albumin: 4 g/dL (ref 3.5–5.2)
Alkaline Phosphatase: 74 U/L (ref 39–117)
BUN: 8 mg/dL (ref 6–23)
CO2: 26 meq/L (ref 19–32)
Calcium: 9 mg/dL (ref 8.4–10.5)
Chloride: 98 meq/L (ref 96–112)
Creatinine, Ser: 0.71 mg/dL (ref 0.40–1.50)
GFR: 85.78 mL/min (ref >60.00)
Glucose, Bld: 93 mg/dL (ref 70–99)
Potassium: 4 meq/L (ref 3.5–5.1)
Sodium: 131 meq/L — ABNORMAL LOW (ref 135–145)
Total Bilirubin: 0.8 mg/dL (ref 0.2–1.2)
Total Protein: 6.6 g/dL (ref 6.0–8.3)

## 2023-09-10 ENCOUNTER — Encounter: Payer: Self-pay | Admitting: Pulmonary Disease

## 2023-09-10 ENCOUNTER — Ambulatory Visit: Payer: Medicare Other | Admitting: Pulmonary Disease

## 2023-09-10 VITALS — BP 106/74 | HR 62 | Temp 97.8°F | Ht 69.5 in | Wt 155.2 lb

## 2023-09-10 DIAGNOSIS — Z8616 Personal history of COVID-19: Secondary | ICD-10-CM | POA: Diagnosis not present

## 2023-09-10 DIAGNOSIS — J84112 Idiopathic pulmonary fibrosis: Secondary | ICD-10-CM | POA: Diagnosis not present

## 2023-09-10 DIAGNOSIS — R918 Other nonspecific abnormal finding of lung field: Secondary | ICD-10-CM

## 2023-09-10 DIAGNOSIS — Z5181 Encounter for therapeutic drug level monitoring: Secondary | ICD-10-CM

## 2023-09-10 DIAGNOSIS — J849 Interstitial pulmonary disease, unspecified: Secondary | ICD-10-CM

## 2023-09-10 NOTE — Patient Instructions (Signed)
 VISIT SUMMARY:  You were seen today for issues related to your idiopathic pulmonary fibrosis (IPF) and medication-related diarrhea. We discussed your current treatment with Ofev , which you are taking at a reduced dose due to diarrhea. You also mentioned experiencing gas and alternating diarrhea and constipation. We reviewed your dietary habits and discussed the safety of consuming dairy products.  YOUR PLAN:  -IDIOPATHIC PULMONARY FIBROSIS (IPF): IPF is a lung disease that causes scarring of the lung tissue, making it difficult to breathe. You are currently managing this condition with Ofev , which you are taking at a reduced dose of 150 mg once daily due to previous severe diarrhea. We discussed the possibility of increasing the dose back to 150 mg twice daily while using Imodium to manage the diarrhea. Please use MyChart to communicate any medication adjustments.  -DIARRHEA DUE TO MEDICATION: Your diarrhea is associated with taking Ofev . We recommend taking Imodium with Ofev  to prevent diarrhea. Additionally, increasing your dietary fiber intake and considering probiotics may help regulate your bowel movements.  -CONSTIPATION: You experience intermittent constipation following episodes of diarrhea. To manage this, avoid using Colace as it may worsen diarrhea. Instead, increase your dietary fiber intake and consider using probiotics to help regulate your bowel movements.  -GAS: You have been experiencing gas that is not well managed with Gas-X. We discussed trying alternative treatments such as simethicone or Mylanta for better gas management.  INSTRUCTIONS:  Please continue taking Ofev  at 150 mg once daily and consider increasing to 150 mg twice daily if you can manage the diarrhea with Imodium. Use MyChart to communicate any medication adjustments. Increase your dietary fiber intake and consider using probiotics to help regulate your bowel movements. For gas management, try simethicone or Mylanta.  Avoid using Colace to prevent worsening diarrhea.

## 2023-09-10 NOTE — Progress Notes (Deleted)
 Randy [name]    [id number]    January 18, 1941  Primary Care Physician:Hammer, Kelle Pate, MD  Referring Physician: Benedetto Brady, MD [address] [place],  [place] [address]  Chief complaint: Follow-up for lung nodule, interstitial lung disease  HPI: 83 y.o. who  has a past medical history of Arthritis, CAD (coronary artery disease), Hypercholesterolemia, Melanoma (HCC) (2009), and Rosacea.   Discussed the use of AI scribe software for clinical note transcription with the patient, who gave verbal consent to proceed.  The patient, with a past medical history of coronary artery disease, melanoma, and lung nodules, presents for a follow-up visit regarding lung nodules. The patient reports no current symptoms related to the lungs, such as shortness of breath or cough. The patient has a history of smoking but quit about 50 years ago. The patient's sister had pulmonary fibrosis. The patient has been found to have some scarring in the lungs, more on the right than the left. The patient reports no exposure to chemicals, mining, or other potential lung irritants. The patient does report a history of pneumonia in the 1980s but was not hospitalized for it. The patient also reports having acid reflux.   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  No feather pillows or comforters ILD questionnaire 03/17/2023-negative Smoking history: 30-pack-year smoker.  Quit in 1989 Travel history: No significant travel history Relevant family history: Patient's sister had unspecified lung issues and possibly pulmonary fibrosis.  Interim history: Discussed the use of AI scribe software for clinical note transcription with the patient, who gave verbal consent to proceed.  The patient presents for evaluation of an abnormal CT scan revealing pulmonary fibrosis or interstitial lung disease. He recently recovered from COVID-19 in  early January 2024, experiencing severe headaches, diarrhea, and a sore throat, but no fever. He was treated with Paxlovid. He denies any known exposures to substances like asbestos that could cause lung disease. He has a family history of severe asthma and heart problems. His sister also had breathing problems and was obese. The patient's labs were negative for autoimmune diseases like lupus and rheumatoid arthritis.   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  Outpatient Encounter Medications as of 09/10/2023  Medication Sig   aspirin  EC 81 MG tablet Take 1 tablet (81 mg total) by mouth daily. Swallow whole.   atorvastatin  (LIPITOR) 20 MG tablet Take 1 tablet (20 mg total) by mouth daily.   Ergocalciferol (VITAMIN D2) 50 MCG (2000 UT) TABS Take by mouth.   ezetimibe  (ZETIA ) 10 MG tablet TAKE 1 TABLET BY MOUTH DAILY   meclizine (ANTIVERT) 25 MG tablet 25 mg daily.   meloxicam (MOBIC) 15 MG tablet Take 15 mg by mouth daily as needed.   nitroGLYCERIN  (NITROSTAT ) 0.4 MG SL tablet PLACE 1 TAB UNDER TONGUE EVERY 5 MINS, UP TO 3 DOSES AS NEEDED FOR CHEST PAIN- CALL 911.   docusate sodium (COLACE) 100 MG capsule Take 100 mg by mouth 2 (two) times daily. (Patient not taking: Reported on 09/10/2023)   Nintedanib (OFEV ) 150 MG CAPS Take 1 capsule (150 mg total) by mouth 2 (two) times daily. (Patient not taking: Reported on 09/10/2023)   Facility-Administered Encounter Medications as of 09/10/2023  Medication   0.9 %  sodium chloride  infusion    Physical Exam: Blood pressure 129/82, pulse 73, height 5\' 9"  (1.753  m), weight 166 lb 12.8 oz (75.7 kg), SpO2 96%. Gen:      No acute distress HEENT:  EOMI, sclera anicteric Neck:     No masses; no thyromegaly Lungs:    Clear to auscultation bilaterally; normal respiratory effort CV:         Regular rate and rhythm; no murmurs Abd:      + bowel sounds; soft, non-tender; no palpable masses, no  distension Ext:    No edema; adequate peripheral perfusion Skin:      Warm and dry; no rash Neuro: alert and oriented x 3 Psych: normal mood and affect   Data Reviewed: Imaging: High resolution CT 12/25/2022- Interstitial lung disease right greater than left and probable UIP pattern.  Stable pulmonary nodule measuring 6 mm, thoracic aorta 4.2 cm, aortic, coronary atherosclerosis. I have reviewed the images personally.  PFTs: 02/27/2023 FVC 3.42 [97%], FEV1 3.31 [135%], F/F97, TLC 6.45 [99%], DLCO 25.17 [114%] Normal test  Labs: CTD serologies 03/17/2023-negative  Assessment and Plan Idiopathic Pulmonary Fibrosis (IPF) Discussed at multidisciplinary conference January 2025.  No significant exposures, no signs of connective tissue disease, CTD serologies are negative with probable UIP pattern on CT  Stable on recent CT scans 4 months apart in 2024, but potential for progressive disease. No current dyspnea. Discussed the risks/benefits of starting medication to slow disease progression (potential side effects including diarrhea, stomach upset, weight loss, fatigue, and liver monitoring).  -Start two times a day Ofev  for IPF.  Recent LFTs are normal -Communicate any side effects experienced for potential dose adjustment or additional medication to manage side effects. -Apply for patient assistance for medication cost. -Next CT scan in one year to monitor disease progression.  COVID-19 Recovered with treatment of Paxlovid. Presented with severe headaches, diarrhea, and sore throat. No fever or respiratory symptoms. -No further action required at this time.  Lung Nodules Stable, likely benign lung nodules identified on CT chest. -Continue annual CT chest surveillance to monitor nodules.  Coronary Artery Disease History of coronary artery disease with stent placement in 2000. -Continue current management.  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 -Continue current management.   Follow-up in three months.   Recommendations: Start Ofev   Janes Colegrove MD [facility] 09/10/2023, 10:40 AM  CC: Benedetto Brady, MD

## 2023-09-10 NOTE — Progress Notes (Addendum)
 Randy [name]    [id number]    [birth year]  Primary Care Physician:Hammer, Kelle Pate, MD  Referring Physician: Benedetto Brady, MD [address] [place],  [place] [address]  Chief complaint:  Follow-up for lung nodule, IPF Started Ofev  February 2025  HPI: 83 y.o. who  has a past medical history of Arthritis, CAD (coronary artery disease), Hypercholesterolemia, Melanoma (HCC) (2009), and Rosacea.   Discussed the use of AI scribe software for clinical note transcription with the patient, who gave verbal consent to proceed.  The patient, with a past medical history of coronary artery disease, melanoma, and lung nodules, presents for a follow-up visit regarding lung nodules. The patient reports no current symptoms related to the lungs, such as shortness of breath or cough. The patient has a history of smoking but quit about 50 years ago. The patient's sister had pulmonary fibrosis. The patient has been found to have some scarring in the lungs, more on the right than the left. The patient reports no exposure to chemicals, mining, or other potential lung irritants. The patient does report a history of pneumonia in the 1980s but was not hospitalized for it. The patient also reports having acid reflux.   Discussed at multidisciplinary conference January 2025.  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  No feather pillows or comforters ILD questionnaire 03/17/2023-negative Smoking history: 30-pack-year smoker.  Quit in 1989 Travel history: No significant travel history Relevant family history: Patient's sister had unspecified lung issues and possibly pulmonary fibrosis.  Interim history: Discussed the use of AI scribe software for  clinical note transcription with the patient, who gave verbal consent to proceed. History of Present Illness Randy [name] is an 83 year old male with idiopathic pulmonary fibrosis who presents with medication-related diarrhea.  He began experiencing severe diarrhea after starting Ofev  for idiopathic pulmonary fibrosis on July 16, 2023. The diarrhea started approximately three weeks ago and was initially managed with anti-diarrheal medication, which took about a week to control. Currently, he is taking Ofev  at a reduced dose of 150 mg once daily, down from the prescribed 150 mg twice daily, due to the diarrhea. He wants to increase the dose back to twice daily but is concerned about managing the diarrhea. He has been using Imodium as needed when diarrhea occurs.  He experiences gas and has been using Gas-X, which he feels is not very effective. He inquires about alternative treatments for gas relief. He has a history of using Colace for bowel movements but has stopped due to concerns about exacerbating diarrhea. He describes his bowel movements as 'large and compressed' following episodes of diarrhea, indicating alternating diarrhea and constipation.  He discusses dietary habits, mentioning that he consumes Cheerios for fiber. He is not currently experiencing any shortness of breath and his oxygen levels are stable at 95%. He inquires about the safety of consuming dairy products, noting that he has not had issues with dairy before starting Ofev .    Outpatient Encounter Medications as of 09/10/2023  Medication Sig   aspirin  EC 81 MG tablet Take 1 tablet (81 mg total) by mouth daily. Swallow whole.   atorvastatin  (LIPITOR) 20 MG tablet Take 1 tablet (20 mg total) by mouth daily.  Ergocalciferol (VITAMIN D2) 50 MCG (2000 UT) TABS Take by mouth.   ezetimibe  (ZETIA ) 10 MG tablet TAKE 1 TABLET BY MOUTH DAILY   meclizine (ANTIVERT) 25 MG tablet 25 mg daily.   meloxicam (MOBIC) 15 MG tablet Take 15 mg  by mouth daily as needed.   nitroGLYCERIN  (NITROSTAT ) 0.4 MG SL tablet PLACE 1 TAB UNDER TONGUE EVERY 5 MINS, UP TO 3 DOSES AS NEEDED FOR CHEST PAIN- CALL 911.   docusate sodium (COLACE) 100 MG capsule Take 100 mg by mouth 2 (two) times daily. (Patient not taking: Reported on 09/10/2023)   Nintedanib (OFEV ) 150 MG CAPS Take 1 capsule (150 mg total) by mouth 2 (two) times daily. (Patient not taking: Reported on 09/10/2023)   Facility-Administered Encounter Medications as of 09/10/2023  Medication   0.9 %  sodium chloride  infusion    Physical Exam: Blood pressure 106/74, pulse 62, temperature 97.8 F (36.6 C), temperature source Oral, height 5' 9.5" (1.765 m), weight 155 lb 3.2 oz (70.4 kg), SpO2 95%. Gen:      No acute distress HEENT:  EOMI, sclera anicteric Neck:     No masses; no thyromegaly Lungs:    Bibasal crackles CV:         Regular rate and rhythm; no murmurs Abd:      + bowel sounds; soft, non-tender; no palpable masses, no distension Ext:    No edema; adequate peripheral perfusion Skin:      Warm and dry; no rash Neuro: alert and oriented x 3 Psych: normal mood and affect   Data Reviewed: Imaging: High resolution CT 12/25/2022- Interstitial lung disease right greater than left and probable UIP pattern.  Stable pulmonary nodule measuring 6 mm, thoracic aorta 4.2 cm, aortic, coronary atherosclerosis. I have reviewed the images personally.  PFTs: 02/27/2023 FVC 3.42 [97%], FEV1 3.31 [135%], F/F97, TLC 6.45 [99%], DLCO 25.17 [114%] Normal test  Labs: CTD serologies 03/17/2023-negative Assessment & Plan Idiopathic Pulmonary Fibrosis (IPF) Discussed at multidisciplinary conference January 2025.  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 Currently on 150 mg once daily due to previous severe diarrhea. Considering increasing to 150 mg twice  daily. Discussed dosage impact on quality of life, with minimal difference in disease progression between 150 mg and 100 mg doses. Oxygen saturation at 95%.  - Continue Ofev  150 mg once daily.  Labs this week showed normal liver test - Consider increasing Ofev  to 150 mg twice daily with Imodium to manage diarrhea.  If diarrhea recurs then reduce Ofev  dose to 100 mg twice daily - Use MyChart for communication regarding medication adjustments.  Diarrhea due to medication Diarrhea associated with Ofev , previously severe, now managed with Imodium. Discussed proactive Imodium use with Ofev . Imodium is safe with minimal side effects. Recommended dietary fiber and probiotics to regulate bowel movements. - Take Imodium with Ofev  to prevent diarrhea. - Increase dietary fiber intake. - Consider probiotics to regulate bowel movements.  Constipation Intermittent constipation following diarrhea episodes. Advised against Colace due to potential diarrhea exacerbation. Recommended dietary fiber and probiotics. - Avoid Colace to prevent worsening diarrhea. - Increase dietary fiber intake. - Consider probiotics to regulate bowel movements.  Gas Gas not well managed with current Gas-X use. Discussed alternative options such as simethicone or Mylanta. - Consider simethicone or Mylanta for gas management.   Lung Nodules Stable, likely benign lung nodules identified on CT  chest. -Continue annual CT chest surveillance to monitor nodules.  Coronary Artery Disease History of coronary artery disease with stent placement in 2000. -Continue current management.  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 -Continue current management.   Follow-up in three months.   Recommendations: Continue Ofev  Continue therapeutic drug monitoring Dosage adjustment for diarrhea with Imodium, dietary fiber, probiotics  Geisha Abernathy MD [facility] 09/10/2023, 10:42 AM  CC: Benedetto Brady, MD

## 2023-09-11 ENCOUNTER — Ambulatory Visit
Admission: RE | Admit: 2023-09-11 | Discharge: 2023-09-11 | Disposition: A | Discharge status: Home / Self Care | Source: Ambulatory Visit | Attending: Family Medicine | Admitting: Family Medicine

## 2023-09-11 DIAGNOSIS — I77819 Aortic ectasia, unspecified site: Secondary | ICD-10-CM

## 2023-09-11 DIAGNOSIS — J84114 Acute interstitial pneumonitis: Secondary | ICD-10-CM | POA: Diagnosis not present

## 2023-09-11 DIAGNOSIS — I2510 Atherosclerotic heart disease of native coronary artery without angina pectoris: Secondary | ICD-10-CM | POA: Diagnosis not present

## 2023-09-11 DIAGNOSIS — R911 Solitary pulmonary nodule: Secondary | ICD-10-CM | POA: Diagnosis not present

## 2023-09-11 DIAGNOSIS — I700 Atherosclerosis of aorta: Secondary | ICD-10-CM | POA: Diagnosis not present

## 2023-09-11 MED ORDER — IOPAMIDOL (ISOVUE-370) INJECTION 76%
75.0000 mL | Freq: Once | INTRAVENOUS | Status: AC | PRN
  Administered 2023-09-11: 75 mL via INTRAVENOUS

## 2023-09-18 ENCOUNTER — Encounter: Payer: Self-pay | Admitting: Pulmonary Disease

## 2023-09-22 MED ORDER — OFEV 100 MG PO CAPS
100.0000 mg | ORAL_CAPSULE | Freq: Two times a day (BID) | ORAL | 1 refills | Status: DC
Start: 2023-09-22 — End: 2023-10-28

## 2023-09-22 NOTE — Telephone Encounter (Signed)
 Please advisee on dose change?

## 2023-09-22 NOTE — Telephone Encounter (Signed)
 Okay to reduce Ofev  dose to 100 mg twice daily due to side effects of diarrhea Sending message to pharmacy team

## 2023-09-29 ENCOUNTER — Ambulatory Visit: Payer: Self-pay

## 2023-09-29 DIAGNOSIS — N3001 Acute cystitis with hematuria: Secondary | ICD-10-CM | POA: Diagnosis not present

## 2023-09-29 DIAGNOSIS — N485 Ulcer of penis: Secondary | ICD-10-CM | POA: Diagnosis not present

## 2023-09-29 DIAGNOSIS — R300 Dysuria: Secondary | ICD-10-CM | POA: Diagnosis not present

## 2023-09-29 NOTE — Telephone Encounter (Signed)
 Please advise Dr. Waylan Haggard

## 2023-09-29 NOTE — Telephone Encounter (Signed)
 Copied from CRM [id number]. Topic: Clinical - Red Word Triage >> Sep 29, 2023  8:28 AM Hilton Lucky wrote: Red Word that prompted transfer to Nurse Triage: States bladder infection and new sore in lower region - believes it is a side effect of OFEV .  Daughter calling in - not with patient. States wiil need to call him on his cell phone but is available to speak at this time.  E2C2 Pulmonary Triage - Initial Assessment Questions "Chief Complaint (e.g., cough, sob, wheezing, fever, chills, sweat or additional symptoms) *Go to specific symptom protocol after initial questions. Medication Side Effects  Renee, Daughter called in and disconnected from the agent after she left a message for triage to contact her father.   Leighton Punches believes that her father's medication OFEV  is causing him to experience side effects that are giving him a bladder infection.

## 2023-09-29 NOTE — Telephone Encounter (Signed)
 Urinary tract infection is a minor side effect of Ofev  Agree with following up with primary care to get checked out for UTI.  If the patient wants we can order the urine analysis and urine culture from our office as well Hold Ofev  for now until he can be reassessed at upcoming clinic visit next month

## 2023-09-29 NOTE — Telephone Encounter (Signed)
 TRIAGE SUMMARY NOTE: Pt reporting that he recently had changes to ofev  med, and recently been having urinary symptoms, including burning with urination, urinary frequency, and redness/inflammation on top side of penis. Pt confirms he experiences other effects from ofev  like pain in abdomen before BM, constipation, and diarrhea. Pt is concerned that ofev  may have caused his urinary/genital symptoms as well, seeking further recommendations. Pt confirms no SOB, chest pain, fever, or pain elsewhere. Advised pt call his PCP office with [facility] for appt in next 24 hours, advised that sending HP message over to pulm for call back with further recommendations as needed in regards to symptoms with ofev . Please advise.  E2C2 Pulmonary Triage - Initial Assessment Questions "Chief Complaint (e.g., cough, sob, wheezing, fever, chills, sweat or additional symptoms) *Go to specific symptom protocol after initial questions. Taking 150 mg ofev  2x/day, Dr. Waylan Haggard sent in script for 100 mg, don't know if need to talk to us  or PCP but may have UTI, burning with urination but currently not taking ofev  Redness and inflammation to top side of penis especially in one spot Before BM have pain in abdomen since started ofev , constipation to diarrhea No SOB or chest pain  Reason for Disposition  Pain or burning with passing urine  Answer Assessment - Initial Assessment Questions 1. SYMPTOM: "What's the main symptom you're concerned about?" (e.g., discharge from penis, rash, pain, itching, swelling)     Seems to be inflammation on top side of my personal Redness at top of penis Inflammation appears to just be on top side, one spot that's really red, no sores Urinating very frequently Redness scattered but not streaking No blood in urine Pain when urinate  Minor pain constantly at urethra where urine comes out, 2/10 just surpasses irritation No pain at inflammation, not itchy, no fever 2. LOCATION: "Where is the  inflammation located?"     Top side of penis 4. PAIN: "Is there any pain?" If Yes, ask: "How bad is it?"  (Scale 1-10; or mild, moderate, severe)     No pain at inflammation but 2/10 with passing urine and continually at urethra 7. OTHER SYMPTOMS: "Do you have any other symptoms?" (e.g., fever, abdomen pain, blood in urine)     Urinary frequency, burning with urination  Protocols used: Penis and Scrotum Symptoms-A-AH

## 2023-09-29 NOTE — Telephone Encounter (Signed)
 Pt is aware of below message/recommendations. He was seen at [facility] and had a urine culture.  He will keep scheduled appt for 10/27/2023. Nothing further needed.

## 2023-09-29 NOTE — Telephone Encounter (Signed)
 First attempt to call pt, no ringing, straight to VM, LVM for call back to pulm office to triage. Placed in call back.

## 2023-10-02 DIAGNOSIS — R3129 Other microscopic hematuria: Secondary | ICD-10-CM | POA: Diagnosis not present

## 2023-10-02 DIAGNOSIS — N481 Balanitis: Secondary | ICD-10-CM | POA: Diagnosis not present

## 2023-10-02 DIAGNOSIS — R300 Dysuria: Secondary | ICD-10-CM | POA: Diagnosis not present

## 2023-10-09 ENCOUNTER — Other Ambulatory Visit (INDEPENDENT_AMBULATORY_CARE_PROVIDER_SITE_OTHER)

## 2023-10-09 ENCOUNTER — Other Ambulatory Visit

## 2023-10-09 DIAGNOSIS — Z5181 Encounter for therapeutic drug level monitoring: Secondary | ICD-10-CM

## 2023-10-09 DIAGNOSIS — J849 Interstitial pulmonary disease, unspecified: Secondary | ICD-10-CM | POA: Diagnosis not present

## 2023-10-09 LAB — COMPREHENSIVE METABOLIC PANEL WITH GFR
ALT: 14 U/L (ref 0–53)
AST: 19 U/L (ref 0–37)
Albumin: 3.9 g/dL (ref 3.5–5.2)
Alkaline Phosphatase: 72 U/L (ref 39–117)
BUN: 13 mg/dL (ref 6–23)
CO2: 30 meq/L (ref 19–32)
Calcium: 9 mg/dL (ref 8.4–10.5)
Chloride: 98 meq/L (ref 96–112)
Creatinine, Ser: 0.69 mg/dL (ref 0.40–1.50)
GFR: 86.47 mL/min (ref >60.00)
Glucose, Bld: 92 mg/dL (ref 70–99)
Potassium: 3.9 meq/L (ref 3.5–5.1)
Sodium: 133 meq/L — ABNORMAL LOW (ref 135–145)
Total Bilirubin: 0.7 mg/dL (ref 0.2–1.2)
Total Protein: 6.8 g/dL (ref 6.0–8.3)

## 2023-10-14 DIAGNOSIS — R300 Dysuria: Secondary | ICD-10-CM | POA: Diagnosis not present

## 2023-10-14 DIAGNOSIS — N342 Other urethritis: Secondary | ICD-10-CM | POA: Diagnosis not present

## 2023-10-27 ENCOUNTER — Encounter: Payer: Self-pay | Admitting: Pulmonary Disease

## 2023-10-27 ENCOUNTER — Ambulatory Visit: Admitting: Pulmonary Disease

## 2023-10-27 VITALS — BP 116/71 | HR 58 | Ht 70.0 in | Wt 158.0 lb

## 2023-10-27 DIAGNOSIS — J849 Interstitial pulmonary disease, unspecified: Secondary | ICD-10-CM

## 2023-10-27 DIAGNOSIS — R0609 Other forms of dyspnea: Secondary | ICD-10-CM | POA: Diagnosis not present

## 2023-10-27 DIAGNOSIS — Z87891 Personal history of nicotine dependence: Secondary | ICD-10-CM

## 2023-10-27 DIAGNOSIS — Z5181 Encounter for therapeutic drug level monitoring: Secondary | ICD-10-CM | POA: Diagnosis not present

## 2023-10-27 NOTE — Patient Instructions (Signed)
 VISIT SUMMARY:  Today, we discussed your concerns about the use of Ofev  and the side effects you experienced. You reported feeling better after stopping the medication. We also reviewed your urinary symptoms, gastrointestinal issues, and the resolved penile ulcer.  YOUR PLAN:  -DIARRHEA DUE TO MEDICATION: Diarrhea is a common side effect of Ofev . You experienced severe diarrhea, which led to stopping the medication. We will restart Ofev  at 100 mg once daily for four weeks. If you tolerate it well, we will increase the dose to 100 mg twice daily. If the symptoms return, we may need to stop the medication again and consider an alternative, such as pirfenidone. We will also follow up with the pharmacy regarding your prescription.  -CONSTIPATION DUE TO MEDICATION: Constipation can also be a side effect of Ofev . You reported alternating between diarrhea and constipation with large and painful bowel movements. To help manage this, increase your dietary fiber intake and consider using over-the-counter dietary fiber supplements and probiotics.  -URINARY SYMPTOMS, UNSPECIFIED: You experienced burning during urination, but multiple tests showed no infection. These symptoms have improved after stopping Ofev , though the connection is unclear. You have an upcoming appointment with a urologist in July for further evaluation.  -RESOLVED PENILE ULCER: You had a penile ulcer that did not improve with ketoconazole but resolved with mupirocin. This issue is now resolved.  INSTRUCTIONS:  Please follow up with the urologist in July for further evaluation of your urinary symptoms. Restart Ofev  at 100 mg once daily and increase to 100 mg twice daily after four weeks if tolerated. If you experience recurrent symptoms, contact us  immediately.

## 2023-10-27 NOTE — Progress Notes (Signed)
 Randy [name]    [id number]    06/18/1940  Primary Care Physician:Hammer, Kelle Pate, MD  Referring Physician: Benedetto Brady, MD [address] [place],  [place] [address]  Chief complaint:  Follow-up for lung nodule, IPF Started Ofev  February 2025  HPI: 83 y.o. who  has a past medical history of Arthritis, CAD (coronary artery disease), Hypercholesterolemia, Melanoma (HCC) (2009), and Rosacea.   Discussed the use of AI scribe software for clinical note transcription with the patient, who gave verbal consent to proceed.  The patient, with a past medical history of coronary artery disease, melanoma, and lung nodules, presents for a follow-up visit regarding lung nodules. The patient reports no current symptoms related to the lungs, such as shortness of breath or cough. The patient has a history of smoking but quit about 50 years ago. The patient's sister had pulmonary fibrosis. The patient has been found to have some scarring in the lungs, more on the right than the left. The patient reports no exposure to chemicals, mining, or other potential lung irritants. The patient does report a history of pneumonia in the 1980s but was not hospitalized for it. The patient also reports having acid reflux.   Discussed at multidisciplinary conference January 2025.  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  No feather pillows or comforters ILD questionnaire 03/17/2023-negative Smoking history: 30-pack-year smoker.  Quit in 1989 Travel history: No significant travel history Relevant family history: Patient's sister had unspecified lung issues and possibly pulmonary fibrosis.  Interim history: Discussed the use of AI scribe software for  clinical note transcription with the patient, who gave verbal consent to proceed. History of Present Illness  Randy [name] is an 83 year old male who presents with concerns about Ofev  use.  He stopped taking Ofev  due to severe side effects, including diarrhea, constipation, decreased energy, headaches, and stomach pains. He has been off the medication for about a month and feels better, describing himself as 'like his old self'.  He experienced urinary symptoms, including burning during urination, and was seen by multiple healthcare providers. Despite negative urine cultures, he was treated with nitrofurantoin. He also had an ulcer on his penis, which was initially treated with ketoconazole without success, but resolved with mupirocin. He has an upcoming appointment with a urologist in July.  He describes alternating between diarrhea and constipation, with large and painful bowel movements. He has not yet fully recovered from these gastrointestinal symptoms. No current urinary symptoms. Reports resolution of penile ulcer.    Outpatient Encounter Medications as of 10/27/2023  Medication Sig   aspirin  EC 81 MG tablet Take 1 tablet (81 mg total) by mouth daily. Swallow whole.   atorvastatin  (LIPITOR) 20 MG tablet Take 1 tablet (20 mg total) by mouth daily.   docusate sodium (COLACE) 100 MG capsule Take 100 mg by mouth 2 (two) times daily.   Ergocalciferol (VITAMIN D2) 50 MCG (2000 UT) TABS Take by mouth.   ezetimibe  (ZETIA ) 10 MG tablet TAKE 1 TABLET BY MOUTH DAILY   meclizine (ANTIVERT) 25 MG tablet 25 mg daily.   meloxicam (MOBIC) 15 MG tablet Take 15 mg by mouth daily as needed.   nitroGLYCERIN  (NITROSTAT ) 0.4 MG SL tablet PLACE  1 TAB UNDER TONGUE EVERY 5 MINS, UP TO 3 DOSES AS NEEDED FOR CHEST PAIN- CALL 911.   Nintedanib (OFEV ) 100 MG CAPS Take 1 capsule (100 mg total) by mouth 2 (two) times daily. **note dose decrease** (Patient not taking: Reported on 10/27/2023)   Facility-Administered  Encounter Medications as of 10/27/2023  Medication   0.9 %  sodium chloride  infusion    Physical Exam: Blood pressure 116/71, pulse (!) 58, height 5\' 10"  (1.778 m), weight 158 lb (71.7 kg), SpO2 97%. Gen:      No acute distress HEENT:  EOMI, sclera anicteric Neck:     No masses; no thyromegaly Lungs:    Bibasal crackles CV:         Regular rate and rhythm; no murmurs Abd:      + bowel sounds; soft, non-tender; no palpable masses, no distension Ext:    No edema; adequate peripheral perfusion Neuro: alert and oriented x 3 Psych: normal mood and affect   Data Reviewed: Imaging: High resolution CT 12/25/2022- Interstitial lung disease right greater than left and probable UIP pattern.  Stable pulmonary nodule measuring 6 mm, thoracic aorta 4.2 cm, aortic, coronary atherosclerosis. I have reviewed the images personally.  PFTs: 02/27/2023 FVC 3.42 [97%], FEV1 3.31 [135%], F/F97, TLC 6.45 [99%], DLCO 25.17 [114%] Normal test  Labs: CTD serologies 03/17/2023-negative Assessment & Plan Idiopathic Pulmonary Fibrosis (IPF) Discussed at multidisciplinary conference January 2025.  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 Currently on hold due to side effects of diarrhea and urinary symptoms  - Resume Ofev  at lower dose with taper up and monitor response - Monitor hepatic panel  Diarrhea due to medication Diarrhea is a common side effect of Ofev , which was previously reduced to 100 mg due to this issue. He experienced severe diarrhea, leading to discontinuation of the medication. The relationship between Ofev  and urinary symptoms is unclear as it is a rare side effect, but symptoms have improved after discontinuation. - Restart Ofev  at 100 mg once daily for four weeks. - If tolerated, increase to 100 mg twice daily after four weeks. - Consider  discontinuation if recurrent symptoms occur. - Discuss potential alternative medication, pirfenidone, if Ofev  is not tolerated. - Send a message to the pharmacy to follow up on the prescription for Ofev  100 mg.  Constipation due to medication Constipation was experienced alongside diarrhea, likely due to medication. Bowel movements were described as large and painful, indicating ongoing issues. - Increase dietary fiber intake. - Consider using over-the-counter dietary fiber supplements and probiotics.  Urinary symptoms, unspecified Urinary symptoms, including dysuria, were reported. Multiple cultures and analyses were negative for infection. Symptoms have improved after discontinuing Ofev , but the relationship between Ofev  and urinary symptoms is unclear as it is a rare side effect. - Attend upcoming appointment with a urologist in July for further evaluation.  Resolved penile ulcer A penile ulcer was previously present and treated with mupirocin, leading to resolution. The ulcer was initially treated with ketoconazole without success.   Lung Nodules Stable, likely benign lung nodules identified on CT chest. -Continue annual CT chest surveillance to monitor nodules.  Coronary Artery Disease History of coronary artery disease with stent placement in 2000. -Continue current management.  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 -Continue current management.   Follow-up in three months.   Recommendations:  Resume Ofev  Continue therapeutic drug monitoring Dosage adjustment for diarrhea with Imodium, dietary fiber, probiotics  Lamine Laton MD [facility] 10/27/2023, 1:24 PM  CC: Benedetto Brady, MD

## 2023-10-27 NOTE — Progress Notes (Deleted)
 Randy [name]    [id number]    01/17/1941  Primary Care Physician:Hammer, Kelle Pate, MD  Referring Physician: Benedetto Brady, MD [address] [place],  [place] [address]  Chief complaint:  Follow-up for lung nodule, IPF Started Ofev  February 2025  HPI: 83 y.o. who  has a past medical history of Arthritis, CAD (coronary artery disease), Hypercholesterolemia, Melanoma (HCC) (2009), and Rosacea.   Discussed the use of AI scribe software for clinical note transcription with the patient, who gave verbal consent to proceed.  The patient, with a past medical history of coronary artery disease, melanoma, and lung nodules, presents for a follow-up visit regarding lung nodules. The patient reports no current symptoms related to the lungs, such as shortness of breath or cough. The patient has a history of smoking but quit about 50 years ago. The patient's sister had pulmonary fibrosis. The patient has been found to have some scarring in the lungs, more on the right than the left. The patient reports no exposure to chemicals, mining, or other potential lung irritants. The patient does report a history of pneumonia in the 1980s but was not hospitalized for it. The patient also reports having acid reflux.   Discussed at multidisciplinary conference January 2025.  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  No feather pillows or comforters ILD questionnaire 03/17/2023-negative Smoking history: 30-pack-year smoker.  Quit in 1989 Travel history: No significant travel history Relevant family history: Patient's sister had unspecified lung issues and possibly pulmonary fibrosis.  Interim history: Discussed the use of AI scribe software for  clinical note transcription with the patient, who gave verbal consent to proceed. History of Present Illness Randy [name] is an 83 year old male with idiopathic pulmonary fibrosis who presents with medication-related diarrhea.  He began experiencing severe diarrhea after starting Ofev  for idiopathic pulmonary fibrosis on July 16, 2023. The diarrhea started approximately three weeks ago and was initially managed with anti-diarrheal medication, which took about a week to control. Currently, he is taking Ofev  at a reduced dose of 150 mg once daily, down from the prescribed 150 mg twice daily, due to the diarrhea. He wants to increase the dose back to twice daily but is concerned about managing the diarrhea. He has been using Imodium as needed when diarrhea occurs.  He experiences gas and has been using Gas-X, which he feels is not very effective. He inquires about alternative treatments for gas relief. He has a history of using Colace for bowel movements but has stopped due to concerns about exacerbating diarrhea. He describes his bowel movements as 'large and compressed' following episodes of diarrhea, indicating alternating diarrhea and constipation.  He discusses dietary habits, mentioning that he consumes Cheerios for fiber. He is not currently experiencing any shortness of breath and his oxygen levels are stable at 95%. He inquires about the safety of consuming dairy products, noting that he has not had issues with dairy before starting Ofev .    Outpatient Encounter Medications as of 10/27/2023  Medication Sig   aspirin  EC 81 MG tablet Take 1 tablet (81 mg total) by mouth daily. Swallow whole.   atorvastatin  (LIPITOR) 20 MG tablet Take 1 tablet (20 mg total) by mouth daily.  docusate sodium (COLACE) 100 MG capsule Take 100 mg by mouth 2 (two) times daily.   Ergocalciferol (VITAMIN D2) 50 MCG (2000 UT) TABS Take by mouth.   ezetimibe  (ZETIA ) 10 MG tablet TAKE 1 TABLET BY MOUTH DAILY    meclizine (ANTIVERT) 25 MG tablet 25 mg daily.   meloxicam (MOBIC) 15 MG tablet Take 15 mg by mouth daily as needed.   nitroGLYCERIN  (NITROSTAT ) 0.4 MG SL tablet PLACE 1 TAB UNDER TONGUE EVERY 5 MINS, UP TO 3 DOSES AS NEEDED FOR CHEST PAIN- CALL 911.   Nintedanib (OFEV ) 100 MG CAPS Take 1 capsule (100 mg total) by mouth 2 (two) times daily. **note dose decrease** (Patient not taking: Reported on 10/27/2023)   Facility-Administered Encounter Medications as of 10/27/2023  Medication   0.9 %  sodium chloride  infusion    Physical Exam: Blood pressure 106/74, pulse 62, temperature 97.8 F (36.6 C), temperature source Oral, height 5' 9.5" (1.765 m), weight 155 lb 3.2 oz (70.4 kg), SpO2 95%. Gen:      No acute distress HEENT:  EOMI, sclera anicteric Neck:     No masses; no thyromegaly Lungs:    Bibasal crackles CV:         Regular rate and rhythm; no murmurs Abd:      + bowel sounds; soft, non-tender; no palpable masses, no distension Ext:    No edema; adequate peripheral perfusion Skin:      Warm and dry; no rash Neuro: alert and oriented x 3 Psych: normal mood and affect   Data Reviewed: Imaging: High resolution CT 12/25/2022- Interstitial lung disease right greater than left and probable UIP pattern.  Stable pulmonary nodule measuring 6 mm, thoracic aorta 4.2 cm, aortic, coronary atherosclerosis. I have reviewed the images personally.  PFTs: 02/27/2023 FVC 3.42 [97%], FEV1 3.31 [135%], F/F97, TLC 6.45 [99%], DLCO 25.17 [114%] Normal test  Labs: CTD serologies 03/17/2023-negative Assessment & Plan Idiopathic Pulmonary Fibrosis (IPF) Discussed at multidisciplinary conference January 2025.  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 Currently on 150 mg once daily due to previous severe diarrhea. Considering increasing to 150 mg twice daily. Discussed  dosage impact on quality of life, with minimal difference in disease progression between 150 mg and 100 mg doses. Oxygen saturation at 95%.  - Continue Ofev  150 mg once daily.  Labs this week showed normal liver test - Consider increasing Ofev  to 150 mg twice daily with Imodium to manage diarrhea.  If diarrhea recurs then reduce Ofev  dose to 100 mg twice daily - Use MyChart for communication regarding medication adjustments.  Diarrhea due to medication Diarrhea associated with Ofev , previously severe, now managed with Imodium. Discussed proactive Imodium use with Ofev . Imodium is safe with minimal side effects. Recommended dietary fiber and probiotics to regulate bowel movements. - Take Imodium with Ofev  to prevent diarrhea. - Increase dietary fiber intake. - Consider probiotics to regulate bowel movements.  Constipation Intermittent constipation following diarrhea episodes. Advised against Colace due to potential diarrhea exacerbation. Recommended dietary fiber and probiotics. - Avoid Colace to prevent worsening diarrhea. - Increase dietary fiber intake. - Consider probiotics to regulate bowel movements.  Gas Gas not well managed with current Gas-X use. Discussed alternative options such as simethicone or Mylanta. - Consider simethicone or Mylanta for gas management.   Lung Nodules Stable, likely benign lung nodules identified on CT chest. -Continue annual  CT chest surveillance to monitor nodules.  Coronary Artery Disease History of coronary artery disease with stent placement in 2000. -Continue current management.  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 -Continue current management.   Follow-up in three months.   Recommendations: Continue Ofev  Continue therapeutic drug monitoring Dosage adjustment for diarrhea with Imodium, dietary fiber, probiotics  Natalye Kott MD [facility] 10/27/2023, 1:27 PM  CC:  Benedetto Brady, MD

## 2023-10-28 ENCOUNTER — Encounter: Payer: Self-pay | Admitting: Pharmacist

## 2023-10-28 MED ORDER — OFEV 100 MG PO CAPS: 100.0000 mg | ORAL_CAPSULE | Freq: Two times a day (BID) | ORAL | 1 refills | Status: DC

## 2023-10-28 NOTE — Progress Notes (Signed)
 Rx for Ofev  100mg  twice daily sent to [facility]  LFTs on 10/09/23 wnl  Mychart message sent to patient and daughter to advise  Geraldene Kleine, PharmD, MPH, BCPS, CPP Clinical Pharmacist (Rheumatology and Pulmonology)

## 2023-10-28 NOTE — Addendum Note (Signed)
 Addended by: Thais Fill on: 10/28/2023 12:54 PM   Modules accepted: Orders

## 2023-11-04 IMAGING — CT CT ANGIO HEAD-NECK (W OR W/O PERF)
1 of 4 series · 6 of 30 positions shown · non-contrast
Comparison: 05/06/2021 brain MRI

CLINICAL DATA: Dizziness with history of stroke.

EXAM:
CT ANGIOGRAPHY HEAD AND NECK
TECHNIQUE: Multidetector CT imaging of the head and neck was performed using
the standard protocol during bolus administration of intravenous
contrast. Multiplanar CT image reconstructions and MIPs were
obtained to evaluate the vascular anatomy. Carotid stenosis
measurements (when applicable) are obtained utilizing NASCET
criteria, using the distal internal carotid diameter as the
denominator.

[Series 7: head/neck angio · axial · 0.42mm/px · z∈[-334,-64]mm · 6 of 189 slices shown]
[im 27/189  brain]
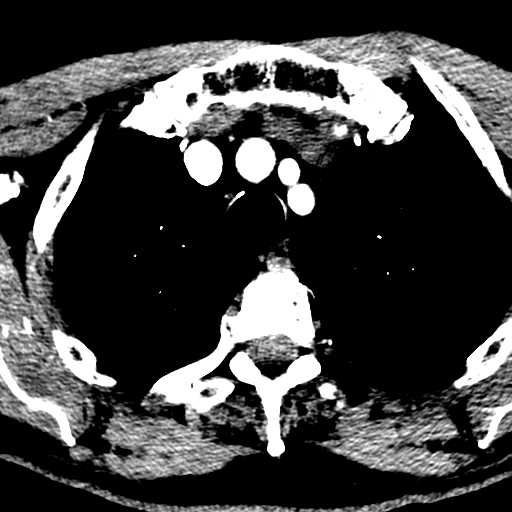
[im 54/189  bone]
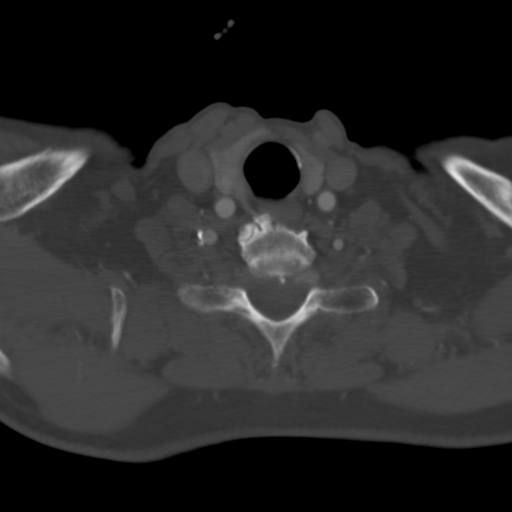
[im 81/189  brain]
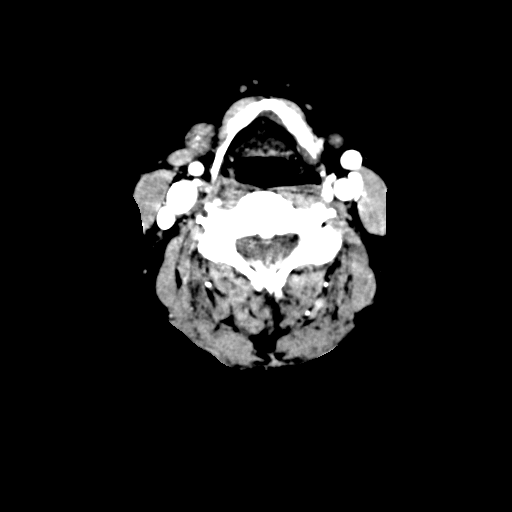
[im 108/189  bone]
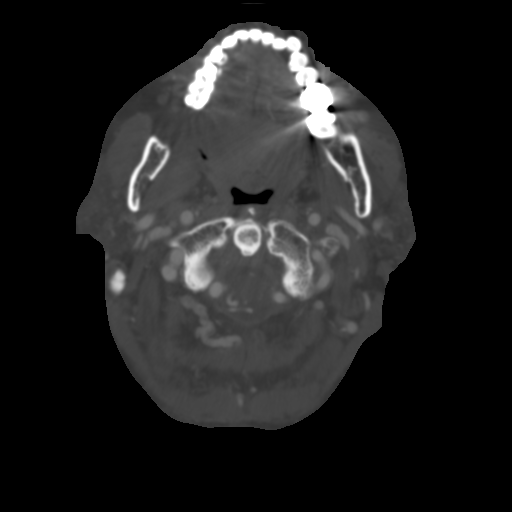
[im 135/189  brain]
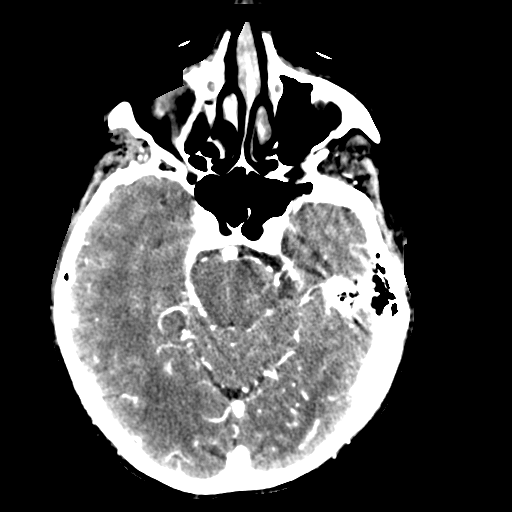
[im 162/189  bone]
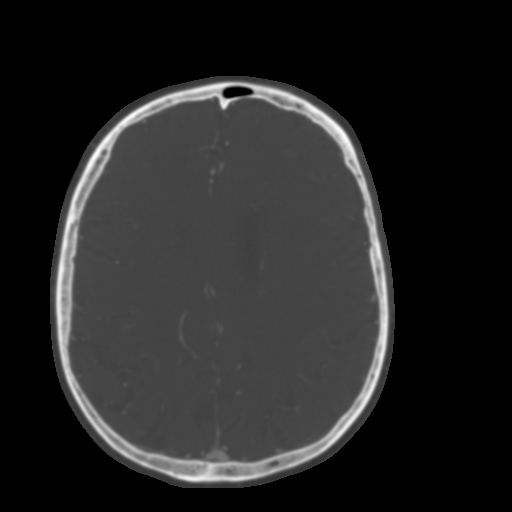

[6 of 30 positions shown; findings below may reference images not displayed]

RADIATION DOSE REDUCTION: This exam was performed according to the
departmental dose-optimization program which includes automated
exposure control, adjustment of the mA and/or kV according to
patient size and/or use of iterative reconstruction technique.

CONTRAST:  75mL CKJWXC-WD6 IOPAMIDOL (CKJWXC-WD6) INJECTION 76%
FINDINGS: CT HEAD FINDINGS

Brain: Small right cerebellar infarct, remote by prior brain MRI. A
right cerebral cortex infarct by MRI is not well seen by CT. No
evidence of new or progressive infarct. Mild chronic small vessel
ischemia in the hemispheric white matter. No hemorrhage or
hydrocephalus.

Vascular: See below

Skull: Negative

Sinuses: Focal right maxillary sinus mucosal thickening which is
progressed from comparison. A dental implant projects into the
inferior right maxillary sinus.

Orbits: Negative

Review of the MIP images confirms the above findings

CTA NECK FINDINGS

Aortic arch: Atheromatous plaque.  Three vessel branching.

Right carotid system: Calcified plaque primarily at the bifurcation
and bulb. No stenosis or ulceration.

Left carotid system: Calcified plaque mainly about the bifurcation
without stenosis or ulceration.

Vertebral arteries: Proximal subclavian atherosclerosis without
stenosis. The right vertebral artery is dominant. Both vertebral
arteries are smoothly contoured and widely patent to the dura.

Skeleton: Ordinary degenerative changes. Remote T1 spinous process
fracture

Other neck: No acute or aggressive finding in the neck.

Upper chest: No acute finding

Review of the MIP images confirms the above findings

CTA HEAD FINDINGS

Anterior circulation: No significant stenosis, proximal occlusion,
aneurysm, or vascular malformation. Calcified plaque on the carotid
siphons

Posterior circulation: Tortuous vertebrals, dominant on the right.
V4 segment atherosclerosis without stenosis or ulceration. No branch
occlusion, beading, or aneurysm.

Venous sinuses: Patent

Anatomic variants: None significant

Review of the MIP images confirms the above findings
IMPRESSION: 1. Mild for age atherosclerosis. No stenosis or embolic source seen
in major arteries of the head and neck.
2. Right maxillary sinusitis that has progressed from 01/04/2021
brain MRI. A right dental implant projects into the lower sinus,
suggest dental follow-up.

## 2023-11-11 ENCOUNTER — Other Ambulatory Visit (INDEPENDENT_AMBULATORY_CARE_PROVIDER_SITE_OTHER)

## 2023-11-11 DIAGNOSIS — J849 Interstitial pulmonary disease, unspecified: Secondary | ICD-10-CM

## 2023-11-11 DIAGNOSIS — Z5181 Encounter for therapeutic drug level monitoring: Secondary | ICD-10-CM | POA: Diagnosis not present

## 2023-11-11 LAB — COMPREHENSIVE METABOLIC PANEL WITH GFR
ALT: 20 U/L (ref 0–53)
AST: 24 U/L (ref 0–37)
Albumin: 3.9 g/dL (ref 3.5–5.2)
Alkaline Phosphatase: 79 U/L (ref 39–117)
BUN: 12 mg/dL (ref 6–23)
CO2: 30 meq/L (ref 19–32)
Calcium: 9 mg/dL (ref 8.4–10.5)
Chloride: 98 meq/L (ref 96–112)
Creatinine, Ser: 0.72 mg/dL (ref 0.40–1.50)
GFR: 85.31 mL/min (ref >60.00)
Glucose, Bld: 87 mg/dL (ref 70–99)
Potassium: 3.8 meq/L (ref 3.5–5.1)
Sodium: 132 meq/L — ABNORMAL LOW (ref 135–145)
Total Bilirubin: 0.8 mg/dL (ref 0.2–1.2)
Total Protein: 6.7 g/dL (ref 6.0–8.3)

## 2023-11-12 ENCOUNTER — Ambulatory Visit: Payer: Self-pay | Admitting: Pulmonary Disease

## 2023-11-17 ENCOUNTER — Encounter (HOSPITAL_BASED_OUTPATIENT_CLINIC_OR_DEPARTMENT_OTHER): Payer: Self-pay

## 2023-11-20 ENCOUNTER — Ambulatory Visit: Attending: Cardiology | Admitting: Cardiology

## 2023-11-20 ENCOUNTER — Encounter: Payer: Self-pay | Admitting: Cardiology

## 2023-11-20 VITALS — BP 114/70 | HR 64 | Ht 70.0 in | Wt 152.0 lb

## 2023-11-20 DIAGNOSIS — R072 Precordial pain: Secondary | ICD-10-CM | POA: Insufficient documentation

## 2023-11-20 DIAGNOSIS — I2510 Atherosclerotic heart disease of native coronary artery without angina pectoris: Secondary | ICD-10-CM | POA: Diagnosis not present

## 2023-11-20 DIAGNOSIS — J849 Interstitial pulmonary disease, unspecified: Secondary | ICD-10-CM | POA: Diagnosis not present

## 2023-11-20 NOTE — Patient Instructions (Signed)
 Medication Instructions:  The current medical regimen is effective;  continue present plan and medications.  *If you need a refill on your cardiac medications before your next appointment, please call your pharmacy*  Follow-Up: At [facility], you and your health needs are our priority.  As part of our continuing mission to provide you with exceptional heart care, our providers are all part of one team.  This team includes your primary Cardiologist (physician) and Advanced Practice Providers or APPs (Physician Assistants and Nurse Practitioners) who all work together to provide you with the care you need, when you need it.  Your next appointment:   1 year(s)  Provider:   One of our Advanced Practice Providers (APPs): Melita Springer, PA-C  Friddie Jetty, NP Evaline Hill, NP  Theotis Flake, PA-C Lawana Pray, NP  Willis Harter, PA-C Lovette Rud, PA-C  [place], PA-C Ernest Dick, NP  Marlana Silvan, NP Marcie Sever, PA-C  Laquita Plant, PA-C    Dayna Dunn, PA-C  Scott Weaver, PA-C Palmer Bobo, NP Katlyn West, NP Callie Goodrich, PA-C  Evan Williams, PA-C Sheng Haley, PA-C  Xika Zhao, NP Kathleen Johnson, PA-C   Then, Dorothye Gathers, MD will plan to see you again in 2 year(s).    We recommend signing up for the patient portal called "MyChart".  Sign up information is provided on this After Visit Summary.  MyChart is used to connect with patients for Virtual Visits (Telemedicine).  Patients are able to view lab/test results, encounter notes, upcoming appointments, etc.  Non-urgent messages can be sent to your provider as well.   To learn more about what you can do with MyChart, go to [email or web address].

## 2023-11-20 NOTE — Progress Notes (Signed)
 Cardiology Office Note:  .   Date:  11/20/2023  ID:  Randy [name], DOB 05-26-1940, MRN [id number] PCP: Leonel Cole, MD  [facility] Providers Cardiologist:  Oneil Parchment, MD     History of Present Illness: .   Randy [name] is a 83 y.o. male Discussed the use of AI scribe software for clinical note transcription with the patient, who gave verbal consent to proceed.  History of Present Illness Randy [name] is an 83 year old male with coronary artery disease who presents for follow-up.  He has a history of coronary artery disease with a bare metal stent placed in the left anterior descending artery in 2000. Cardiac catheterization in 2003 showed a patent stent, and a stress test in 2007 revealed no ischemia. Currently, he has no chest pain or shortness of breath. His medications include aspirin  81 mg daily, atorvastatin  20 mg daily, and Zetia  10 mg daily. Recent laboratory results show an LDL of 56 mg/dL, with normal hemoglobin and creatinine levels.  An echocardiogram performed on Oct 10, 2022, due to shortness of breath, showed normal pump function and diastolic parameters. A nuclear stress test in May 2024 was reassuring with no ischemia. An event monitor indicated no atrial fibrillation and rare PVCs.  He is under the care of a pulmonary doctor (Dr. Theophilus) for a lung nodule and pulmonary fibrosis. Initially, he was on 150 mg of medication twice daily but experienced significant side effects, leading to a reduction to 100 mg. He has been on this dose for about a month and plans to increase to 200 mg next week.  He experiences a burning sensation during urination and has an upcoming appointment with a urologist to address this issue.  Socially, he lives alone but remains active, engaging in activities such as mowing the yard and working in his shop. He limits outdoor activities when temperatures exceed 82 degrees.      ROS: No CP  Studies Reviewed: SABRA   EKG  Interpretation Date/Time:  Thursday November 20 2023 08:18:39 EDT Ventricular Rate:  65 PR Interval:  192 QRS Duration:  106 QT Interval:  398 QTC Calculation: 413 R Axis:   1  Text Interpretation: Normal sinus rhythm Normal ECG When compared with ECG of 09-Aug-2001 03:41, No significant change was found Confirmed by Parchment Oneil ([id number]) on 11/20/2023 8:38:55 AM    Results LABS LDL: 56 (07/2023) Hb: 14.1 (07/2023) Cr: 0.7 (07/2023)  DIAGNOSTIC Echocardiogram: Normal pump function, normal diastolic parameters, ascending aorta 39 mm (10/10/2022) Nuclear stress test: No ischemia (May 2024) Event monitor: No atrial fibrillation, rare PVCs EKG: Normal Risk Assessment/Calculations:            Physical Exam:   VS:  BP 114/70   Pulse 64   Ht 5' 10 (1.778 m)   Wt 152 lb (68.9 kg)   SpO2 97%   BMI 21.81 kg/m    Wt Readings from Last 3 Encounters:  11/20/23 152 lb (68.9 kg)  10/27/23 158 lb (71.7 kg)  09/10/23 155 lb 3.2 oz (70.4 kg)    GEN: Well nourished, well developed in no acute distress NECK: No JVD; No carotid bruits CARDIAC: RRR, no murmurs, no rubs, no gallops RESPIRATORY:  Velcro crackles at bases ABDOMEN: Soft, non-tender, non-distended EXTREMITIES:  No edema; No deformity   ASSESSMENT AND PLAN: .    Assessment and Plan Assessment & Plan Coronary artery disease Coronary artery disease is well-managed with no angina or dyspnea. Previous interventions include  a bare metal stent in the LAD in 2000 and cardiac catheterization in 2003, with a patent stent and no ischemia on stress tests. Recent echocardiogram and nuclear stress test show normal cardiac function and no ischemia. EKG is normal. - Continue aspirin  81 mg daily. - Continue atorvastatin  20 mg daily. - Continue ezetimibe  10 mg daily. - Schedule follow-up in one year with a PA and in two years with me - Advise to reach out if any changes or concerns arise.  Hypercholesterolemia Hypercholesterolemia is  well-controlled with 07/2023 LDL at 56 mg/dL. Current medications include atorvastatin  and ezetimibe , effectively lowering cholesterol and stabilizing plaque. - Continue atorvastatin  20 mg daily. - Continue ezetimibe  10 mg daily. - No need for repeat cholesterol test at this time.  Pulmonary fibrosis Pulmonary fibrosis is managed by a pulmonary specialist. Current treatment includes medication at OFEV100 mg twice daily, with plans to increase to 200 mg. Side effects include diarrhea. Pulmonary examination reveals crackles consistent with interstitial fibrosis. - Continue current pulmonary medication regimen. - Monitor for side effects, particularly diarrhea. - Follow up with pulmonary specialist as needed.  Bladder issue with dysuria Experiencing dysuria, possibly related to medication. Scheduled to see a urologist for further evaluation. - Follow up with urologist for evaluation of dysuria. - Monitor for any changes or worsening of symptoms.         Signed, Oneil Parchment, MD

## 2023-12-01 DIAGNOSIS — N401 Enlarged prostate with lower urinary tract symptoms: Secondary | ICD-10-CM | POA: Diagnosis not present

## 2023-12-01 DIAGNOSIS — R350 Frequency of micturition: Secondary | ICD-10-CM | POA: Diagnosis not present

## 2023-12-01 DIAGNOSIS — N481 Balanitis: Secondary | ICD-10-CM | POA: Diagnosis not present

## 2023-12-01 DIAGNOSIS — R351 Nocturia: Secondary | ICD-10-CM | POA: Diagnosis not present

## 2023-12-01 DIAGNOSIS — R300 Dysuria: Secondary | ICD-10-CM | POA: Diagnosis not present

## 2023-12-01 DIAGNOSIS — R8289 Other abnormal findings on cytological and histological examination of urine: Secondary | ICD-10-CM | POA: Diagnosis not present

## 2023-12-10 ENCOUNTER — Other Ambulatory Visit (INDEPENDENT_AMBULATORY_CARE_PROVIDER_SITE_OTHER)

## 2023-12-10 DIAGNOSIS — Z5181 Encounter for therapeutic drug level monitoring: Secondary | ICD-10-CM | POA: Diagnosis not present

## 2023-12-10 DIAGNOSIS — J849 Interstitial pulmonary disease, unspecified: Secondary | ICD-10-CM

## 2023-12-10 LAB — COMPREHENSIVE METABOLIC PANEL WITH GFR
ALT: 15 U/L (ref 0–53)
AST: 25 U/L (ref 0–37)
Albumin: 3.8 g/dL (ref 3.5–5.2)
Alkaline Phosphatase: 67 U/L (ref 39–117)
BUN: 9 mg/dL (ref 6–23)
CO2: 29 meq/L (ref 19–32)
Calcium: 8.9 mg/dL (ref 8.4–10.5)
Chloride: 100 meq/L (ref 96–112)
Creatinine, Ser: 0.65 mg/dL (ref 0.40–1.50)
GFR: 87.94 mL/min (ref >60.00)
Glucose, Bld: 100 mg/dL — ABNORMAL HIGH (ref 70–99)
Potassium: 3.8 meq/L (ref 3.5–5.1)
Sodium: 134 meq/L — ABNORMAL LOW (ref 135–145)
Total Bilirubin: 0.6 mg/dL (ref 0.2–1.2)
Total Protein: 6.5 g/dL (ref 6.0–8.3)

## 2023-12-19 ENCOUNTER — Ambulatory Visit: Payer: Self-pay | Admitting: Pulmonary Disease

## 2023-12-25 ENCOUNTER — Other Ambulatory Visit: Payer: Self-pay | Admitting: Cardiology

## 2024-01-07 DIAGNOSIS — R351 Nocturia: Secondary | ICD-10-CM | POA: Diagnosis not present

## 2024-01-07 DIAGNOSIS — N401 Enlarged prostate with lower urinary tract symptoms: Secondary | ICD-10-CM | POA: Diagnosis not present

## 2024-01-07 DIAGNOSIS — R3915 Urgency of urination: Secondary | ICD-10-CM | POA: Diagnosis not present

## 2024-01-07 DIAGNOSIS — N481 Balanitis: Secondary | ICD-10-CM | POA: Diagnosis not present

## 2024-01-07 DIAGNOSIS — R300 Dysuria: Secondary | ICD-10-CM | POA: Diagnosis not present

## 2024-01-14 ENCOUNTER — Other Ambulatory Visit (INDEPENDENT_AMBULATORY_CARE_PROVIDER_SITE_OTHER)

## 2024-01-14 DIAGNOSIS — Z5181 Encounter for therapeutic drug level monitoring: Secondary | ICD-10-CM | POA: Diagnosis not present

## 2024-01-14 DIAGNOSIS — J849 Interstitial pulmonary disease, unspecified: Secondary | ICD-10-CM | POA: Diagnosis not present

## 2024-01-14 LAB — COMPREHENSIVE METABOLIC PANEL WITH GFR
ALT: 14 U/L (ref 0–53)
AST: 21 U/L (ref 0–37)
Albumin: 4 g/dL (ref 3.5–5.2)
Alkaline Phosphatase: 68 U/L (ref 39–117)
BUN: 10 mg/dL (ref 6–23)
CO2: 30 meq/L (ref 19–32)
Calcium: 8.9 mg/dL (ref 8.4–10.5)
Chloride: 97 meq/L (ref 96–112)
Creatinine, Ser: 0.69 mg/dL (ref 0.40–1.50)
GFR: 86.31 mL/min (ref >60.00)
Glucose, Bld: 99 mg/dL (ref 70–99)
Potassium: 4.2 meq/L (ref 3.5–5.1)
Sodium: 135 meq/L (ref 135–145)
Total Bilirubin: 0.8 mg/dL (ref 0.2–1.2)
Total Protein: 6.7 g/dL (ref 6.0–8.3)

## 2024-01-22 ENCOUNTER — Ambulatory Visit: Payer: Self-pay | Admitting: Pulmonary Disease

## 2024-02-04 ENCOUNTER — Other Ambulatory Visit: Payer: Self-pay | Admitting: Cardiology

## 2024-02-09 ENCOUNTER — Encounter: Payer: Self-pay | Admitting: Pulmonary Disease

## 2024-02-09 ENCOUNTER — Ambulatory Visit: Admitting: Pulmonary Disease

## 2024-02-09 VITALS — BP 124/70 | HR 60 | Temp 98.6°F | Ht 70.0 in | Wt 150.0 lb

## 2024-02-09 DIAGNOSIS — Z5181 Encounter for therapeutic drug level monitoring: Secondary | ICD-10-CM | POA: Diagnosis not present

## 2024-02-09 DIAGNOSIS — J849 Interstitial pulmonary disease, unspecified: Secondary | ICD-10-CM | POA: Diagnosis not present

## 2024-02-09 DIAGNOSIS — R0609 Other forms of dyspnea: Secondary | ICD-10-CM | POA: Diagnosis not present

## 2024-02-09 NOTE — Progress Notes (Signed)
 Randy [name]    [id number]    11-Sep-1940  Primary Care Physician:Hammer, Cheryle, MD  Referring Physician: Leonel Cheryle, MD [address] [place],  [place] [address]  Chief complaint:  Follow-up for lung nodule, IPF Started nintedanib February 2025  HPI: 83 y.o. who  has a past medical history of Arthritis, CAD (coronary artery disease), Hypercholesterolemia, Melanoma (HCC) (2009), and Rosacea.   Discussed the use of AI scribe software for clinical note transcription with the patient, who gave verbal consent to proceed.  The patient, with a past medical history of coronary artery disease, melanoma, and lung nodules, presents for a follow-up visit regarding lung nodules. The patient reports no current symptoms related to the lungs, such as shortness of breath or cough. The patient has a history of smoking but quit about 50 years ago. The patient's sister had pulmonary fibrosis. The patient has been found to have some scarring in the lungs, more on the right than the left. The patient reports no exposure to chemicals, mining, or other potential lung irritants. The patient does report a history of pneumonia in the 1980s but was not hospitalized for it. The patient also reports having acid reflux.   Discussed at multidisciplinary conference January 2025.  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  Interim history: Discussed the use of AI scribe software for clinical note transcription with the patient, who gave verbal consent to proceed. 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  Idiopathic pulmonary fibrosis symptoms and management - Diagnosed with idiopathic pulmonary fibrosis - Currently managed with nintedanib - Initial dose of nintedanib was  150 mg twice daily, which resulted in severe diarrhea after four to six weeks - Dose reduced to 100 mg once daily, resulting in improved gastrointestinal tolerance and less diarrhea - Increased fatigue and decreased energy level when attempting to increase nintedanib dose to twice daily  Therapeutic device inquiry - Inquired about the use of an incentive spirometer and a flutter valve, referencing his mother's prior use of similar devices for pulmonary fibrosis   Relevant pulmonary history Pets: No pets 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  No feather pillows or comforters ILD questionnaire 03/17/2023-negative Smoking history: 30-pack-year smoker.  Quit in 1989 Travel history: No significant travel history Relevant family history: Patient's sister had unspecified lung issues and possibly pulmonary fibrosis.  Outpatient Encounter Medications as of 02/09/2024  Medication Sig   aspirin  EC 81 MG tablet Take 1 tablet (81 mg total) by mouth daily. Swallow whole.   atorvastatin  (LIPITOR) 20 MG tablet TAKE 1 TABLET BY MOUTH EVERY DAY   docusate sodium (COLACE) 100 MG capsule Take 100 mg by mouth 2 (two) times daily.   Ergocalciferol (VITAMIN D2) 50 MCG (2000 UT) TABS Take by mouth.   ezetimibe  (ZETIA ) 10 MG tablet TAKE 1 TABLET BY MOUTH DAILY   meclizine (ANTIVERT) 25 MG tablet 25 mg daily. (Patient taking differently: 25 mg daily. PRN)   meloxicam (MOBIC) 15 MG tablet Take 15 mg by mouth daily as needed.   Nintedanib (OFEV ) 100 MG CAPS Take 1 capsule (100 mg total) by mouth 2 (two) times daily. **note dose decrease**   nitroGLYCERIN  (NITROSTAT ) 0.4 MG SL tablet  PLACE 1 TAB UNDER TONGUE EVERY 5 MINS, UP TO 3 DOSES AS NEEDED FOR CHEST PAIN- CALL 911. (Patient taking differently: PLACE 1 TAB UNDER TONGUE EVERY 5 MINS, UP TO 3 DOSES AS NEEDED FOR CHEST PAIN- CALL 911. Taken as needed)   tamsulosin (FLOMAX) 0.4 MG CAPS capsule Take by  mouth.   Facility-Administered Encounter Medications as of 02/09/2024  Medication   0.9 %  sodium chloride  infusion    Vitals:   02/09/24 1056  BP: 124/70  Pulse: 60  Temp: 98.6 F (37 C)  Height: 5' 10 (1.778 m)  Weight: 150 lb (68 kg)  SpO2: 98%  TempSrc: Oral  BMI (Calculated): 21.52     Physical Exam GEN: No acute distress. CV: Regular rate and rhythm, no murmurs. LUNGS: Clear to auscultation bilaterally, normal respiratory effort. SKIN JOINTS: Warm and dry, no rash.    Data Reviewed: Imaging: High resolution CT 12/25/2022- Interstitial lung disease right greater than left and probable UIP pattern.  Stable pulmonary nodule measuring 6 mm, thoracic aorta 4.2 cm, aortic, coronary atherosclerosis.  CTA 09/11/2023- Stable appearance of pulmonary fibrosis, stable pulmonary nodule, 3.8 cm thoracic aorta I have reviewed the images personally.  PFTs: 02/27/2023 FVC 3.42 [97%], FEV1 3.31 [135%], F/F97, TLC 6.45 [99%], DLCO 25.17 [114%] Normal test  Labs: CTD serologies 03/17/2023- Negative  Cardiac: Echocardiogram 10/10/2022-LVEF 55-60%, normal RV systolic size and function.  TR is inadequate for assessing PA pressure, TAPSE 1.6 cm Assessment & Plan Idiopathic pulmonary fibrosis Discussed at multidisciplinary conference January 2025.  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  Initial dose of 150 mg twice daily caused severe diarrhea, reduced to 100 mg once daily, improving diarrhea and quality of life. Carob flour suggested to manage diarrhea and potentially increase nintedanib dose. Pirfenidone discussed as alternative, with side effects of weight loss and fatigue. Decision to try carob flour before considering switch to pirfenidone. - Continue nintedanib 100 mg once daily.  LFTs last month were normal.  Repeat hepatic panel in 3 months  for therapeutic monitoring - Try carob flour to manage diarrhea: start with one spoon before breakfast, increase to twice daily if tolerated. - Consider increasing nintedanib to twice daily if diarrhea is controlled with carob flour. - Provide instructions for obtaining and using carob flour. - Monitor liver function tests every three to six months. - Schedule follow-up appointment in three months to reassess treatment plan and consider switching to pirfenidone if necessary.  Urinary issues and penile ulcer Urinary issues and penile ulcer managed with tamsulosin. Ulcer healed, but burning during urination persists. Under urologist care with follow-up planned in two weeks. - Continue tamsulosin as prescribed. - Follow up with urologist in two weeks for ongoing urinary symptoms.   Lung Nodules Stable, likely benign lung nodules identified on CT chest. -Continue annual CT chest surveillance to monitor nodules.  Coronary Artery Disease History of coronary artery disease with stent placement in 2000. -Continue current management.  Recommendations: Continue nintedanib at lower dose Carob flour to help with diarrhea Consider switch to pirfenidone if unable to tolerate nintedanib Continue management of diarrhea with Imodium, dietary fiber, probiotics Continue therapeutic drug monitoring  Lonna Coder MD [facility] 02/09/2024, 11:11 AM  CC: Leonel Cole, MD

## 2024-02-09 NOTE — Patient Instructions (Addendum)
  VISIT SUMMARY: You visited today to discuss the management of your idiopathic pulmonary fibrosis and some urinary issues. We reviewed your current medications and discussed potential adjustments to improve your symptoms.  YOUR PLAN: IDIOPATHIC PULMONARY FIBROSIS: You have been managing your idiopathic pulmonary fibrosis with nintedanib, but experienced severe diarrhea at the higher dose. We reduced the dose, which improved your symptoms, but you still have some fatigue and decreased energy. -Continue taking nintedanib 100 mg once daily. -Try carob flour to manage diarrhea: start with one spoon before breakfast, and increase to twice daily if tolerated. -Consider increasing nintedanib to twice daily if diarrhea is controlled with carob flour. -We will provide instructions for obtaining and using carob flour. -Monitor liver function tests every three to six months. -Schedule a follow-up appointment in three months to reassess your treatment plan and consider switching to pirfenidone if necessary.  Instructions for taking Carob flour Take CAROB Flour for Diarrhea due to medication as follows Take 1 DESSERT spoon  size serving [approximately 7 g] before breakfast If still no response in 3 days then add another 7 g at dinner If still no response in 3 days then make it to spoon servings at breakfast and 2 spoon servings at dinner and hold NOTE: Always MIX the CAROB FLOUR with WATER or MILK or JUICE - MIGHT NEED A BLENDER to do it DO NOT EAT CAROB POWER DIRECTLY - it can choke or make you cough   URINARY ISSUES AND PENILE ULCER: You have been experiencing urinary issues and had a penile ulcer, which has healed. However, you still have burning during urination. -Continue taking tamsulosin as prescribed. -Follow up with your urologist in two weeks for ongoing urinary symptoms.

## 2024-02-10 NOTE — Telephone Encounter (Signed)
 FYI

## 2024-02-17 DIAGNOSIS — D1801 Hemangioma of skin and subcutaneous tissue: Secondary | ICD-10-CM | POA: Diagnosis not present

## 2024-02-17 DIAGNOSIS — L821 Other seborrheic keratosis: Secondary | ICD-10-CM | POA: Diagnosis not present

## 2024-02-17 DIAGNOSIS — L812 Freckles: Secondary | ICD-10-CM | POA: Diagnosis not present

## 2024-02-17 DIAGNOSIS — Z85828 Personal history of other malignant neoplasm of skin: Secondary | ICD-10-CM | POA: Diagnosis not present

## 2024-02-17 DIAGNOSIS — L570 Actinic keratosis: Secondary | ICD-10-CM | POA: Diagnosis not present

## 2024-03-22 DIAGNOSIS — I77819 Aortic ectasia, unspecified site: Secondary | ICD-10-CM | POA: Diagnosis not present

## 2024-03-22 DIAGNOSIS — E559 Vitamin D deficiency, unspecified: Secondary | ICD-10-CM | POA: Diagnosis not present

## 2024-03-22 DIAGNOSIS — M545 Low back pain, unspecified: Secondary | ICD-10-CM | POA: Diagnosis not present

## 2024-03-22 DIAGNOSIS — E7800 Pure hypercholesterolemia, unspecified: Secondary | ICD-10-CM | POA: Diagnosis not present

## 2024-03-22 DIAGNOSIS — J8410 Pulmonary fibrosis, unspecified: Secondary | ICD-10-CM | POA: Diagnosis not present

## 2024-03-22 DIAGNOSIS — J984 Other disorders of lung: Secondary | ICD-10-CM | POA: Diagnosis not present

## 2024-03-22 DIAGNOSIS — R911 Solitary pulmonary nodule: Secondary | ICD-10-CM | POA: Diagnosis not present

## 2024-03-22 DIAGNOSIS — I2510 Atherosclerotic heart disease of native coronary artery without angina pectoris: Secondary | ICD-10-CM | POA: Diagnosis not present

## 2024-03-22 DIAGNOSIS — R200 Anesthesia of skin: Secondary | ICD-10-CM | POA: Diagnosis not present

## 2024-03-22 DIAGNOSIS — R21 Rash and other nonspecific skin eruption: Secondary | ICD-10-CM | POA: Diagnosis not present

## 2024-03-22 DIAGNOSIS — I2721 Secondary pulmonary arterial hypertension: Secondary | ICD-10-CM | POA: Diagnosis not present

## 2024-03-31 DIAGNOSIS — Z961 Presence of intraocular lens: Secondary | ICD-10-CM | POA: Diagnosis not present

## 2024-03-31 DIAGNOSIS — H524 Presbyopia: Secondary | ICD-10-CM | POA: Diagnosis not present

## 2024-03-31 DIAGNOSIS — H2511 Age-related nuclear cataract, right eye: Secondary | ICD-10-CM | POA: Diagnosis not present

## 2024-04-13 DIAGNOSIS — R351 Nocturia: Secondary | ICD-10-CM | POA: Diagnosis not present

## 2024-04-13 DIAGNOSIS — R300 Dysuria: Secondary | ICD-10-CM | POA: Diagnosis not present

## 2024-04-13 DIAGNOSIS — R3914 Feeling of incomplete bladder emptying: Secondary | ICD-10-CM | POA: Diagnosis not present

## 2024-04-13 DIAGNOSIS — N481 Balanitis: Secondary | ICD-10-CM | POA: Diagnosis not present

## 2024-04-13 DIAGNOSIS — N401 Enlarged prostate with lower urinary tract symptoms: Secondary | ICD-10-CM | POA: Diagnosis not present

## 2024-05-03 ENCOUNTER — Other Ambulatory Visit

## 2024-05-03 ENCOUNTER — Other Ambulatory Visit: Payer: Self-pay | Admitting: Pulmonary Disease

## 2024-05-03 DIAGNOSIS — R0600 Dyspnea, unspecified: Secondary | ICD-10-CM

## 2024-05-03 DIAGNOSIS — J849 Interstitial pulmonary disease, unspecified: Secondary | ICD-10-CM

## 2024-05-03 DIAGNOSIS — Z5181 Encounter for therapeutic drug level monitoring: Secondary | ICD-10-CM

## 2024-05-03 LAB — COMPREHENSIVE METABOLIC PANEL WITH GFR
ALT: 14 U/L (ref 0–53)
AST: 21 U/L (ref 0–37)
Albumin: 3.9 g/dL (ref 3.5–5.2)
Alkaline Phosphatase: 60 U/L (ref 39–117)
BUN: 12 mg/dL (ref 6–23)
CO2: 29 meq/L (ref 19–32)
Calcium: 9.4 mg/dL (ref 8.4–10.5)
Chloride: 98 meq/L (ref 96–112)
Creatinine, Ser: 0.67 mg/dL (ref 0.40–1.50)
GFR: 86.89 mL/min (ref >60.00)
Glucose, Bld: 93 mg/dL (ref 70–99)
Potassium: 4.2 meq/L (ref 3.5–5.1)
Sodium: 132 meq/L — ABNORMAL LOW (ref 135–145)
Total Bilirubin: 0.8 mg/dL (ref 0.2–1.2)
Total Protein: 6.6 g/dL (ref 6.0–8.3)

## 2024-05-03 LAB — BRAIN NATRIURETIC PEPTIDE: Pro B Natriuretic peptide (BNP): 50 pg/mL (ref 0.0–100.0)

## 2024-05-05 ENCOUNTER — Ambulatory Visit: Admitting: Pulmonary Disease

## 2024-05-05 ENCOUNTER — Encounter: Payer: Self-pay | Admitting: Pulmonary Disease

## 2024-05-05 VITALS — BP 119/78 | HR 62 | Temp 98.2°F | Ht 70.0 in | Wt 143.0 lb

## 2024-05-05 DIAGNOSIS — Z5181 Encounter for therapeutic drug level monitoring: Secondary | ICD-10-CM | POA: Diagnosis not present

## 2024-05-05 DIAGNOSIS — I2510 Atherosclerotic heart disease of native coronary artery without angina pectoris: Secondary | ICD-10-CM

## 2024-05-05 DIAGNOSIS — J84112 Idiopathic pulmonary fibrosis: Secondary | ICD-10-CM | POA: Diagnosis not present

## 2024-05-05 DIAGNOSIS — R197 Diarrhea, unspecified: Secondary | ICD-10-CM | POA: Diagnosis not present

## 2024-05-05 DIAGNOSIS — R911 Solitary pulmonary nodule: Secondary | ICD-10-CM

## 2024-05-05 DIAGNOSIS — J849 Interstitial pulmonary disease, unspecified: Secondary | ICD-10-CM

## 2024-05-05 NOTE — Patient Instructions (Signed)
°  VISIT SUMMARY: Today, we reviewed your condition of idiopathic pulmonary fibrosis and discussed your medication management. We also addressed your gastrointestinal symptoms and nutritional status.  YOUR PLAN: IDIOPATHIC PULMONARY FIBROSIS: Your condition is being managed with nintedanib, and you are currently on a dose of 100 mg twice daily. You are tolerating this dose well. -Continue taking nintedanib 100 mg twice daily. -We will order a CT scan in April or May to check on the progression of your disease. -Schedule a follow-up appointment after your CT scan.  DIARRHEA DUE TO MEDICATION: You are experiencing diarrhea as a side effect of nintedanib, which you are managing with Imodium and by avoiding dairy products. -Continue using Imodium as needed for diarrhea. -Continue avoiding dairy products to help manage diarrhea.  NUTRITIONAL STATUS AND WEIGHT LOSS: You have experienced some weight loss but are maintaining your nutritional intake with protein-rich foods. -Continue eating protein-rich foods like eggs, chicken, and Ensure to maintain your nutritional intake. -Try not to skip meals, especially dinner, to ensure you are getting enough nutrition.

## 2024-05-05 NOTE — Progress Notes (Signed)
 "              Randy [name]    [id number]    August 13, 1940  Primary Care Physician:Denzal Meir, MD  Referring Physician: Leonel Cole, MD [address] [place],  [place] [address]  Chief complaint:  Follow-up for lung nodule, IPF Started nintedanib February 2025  HPI: 83 y.o. who  has a past medical history of Arthritis, CAD (coronary artery disease), Hypercholesterolemia, Melanoma (HCC) (2009), and Rosacea.   Discussed the use of AI scribe software for clinical note transcription with the patient, who gave verbal consent to proceed.  The patient, with a past medical history of coronary artery disease, melanoma, and lung nodules, presents for a follow-up visit regarding lung nodules. The patient reports no current symptoms related to the lungs, such as shortness of breath or cough. The patient has a history of smoking but quit about 50 years ago. The patient's sister had pulmonary fibrosis. The patient has been found to have some scarring in the lungs, more on the right than the left. The patient reports no exposure to chemicals, mining, or other potential lung irritants. The patient does report a history of pneumonia in the 1980s but was not hospitalized for it. The patient also reports having acid reflux.   Discussed at multidisciplinary conference January 2025.  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  Interim history: Discussed the use of AI scribe software for clinical note transcription with the patient, who gave verbal consent to proceed. 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  Idiopathic pulmonary fibrosis - Idiopathic pulmonary fibrosis managed with nintedanib 100 mg twice  daily. - No mention of worsening respiratory symptoms or acute exacerbation.  Gastrointestinal symptoms and medication tolerance - Diarrhea associated with nintedanib controlled with Imodium as needed. - Dairy intake worsens diarrhea; avoids dairy to improve medication tolerance. - Discontinued carob powder due to poor gastrointestinal tolerance. - Continues nintedanib without additional gastrointestinal issues after body adjustment.  Nutritional status and weight loss - Weight loss present but maintains nutritional intake with protein-rich foods including eggs, chicken, and Ensure. - Sometimes skips dinner but consumes substantial breakfast and lunch to maintain intake.   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  No feather pillows or comforters ILD questionnaire 03/17/2023-negative Smoking history: 30-pack-year smoker.  Quit in 1989 Travel history: No significant travel history Relevant family history: Patient's sister had unspecified lung issues and possibly pulmonary fibrosis.  Outpatient Encounter Medications as of 05/05/2024  Medication Sig   aspirin  EC 81 MG tablet Take 1 tablet (81 mg total) by mouth daily. Swallow whole.   atorvastatin  (LIPITOR) 20 MG tablet TAKE 1 TABLET BY MOUTH EVERY DAY   Ergocalciferol (VITAMIN D2) 50 MCG (2000 UT) TABS Take by mouth.   ezetimibe  (ZETIA ) 10 MG tablet TAKE 1 TABLET BY MOUTH DAILY   meclizine (ANTIVERT) 25 MG tablet 25 mg daily. (Patient taking differently: 25 mg daily. PRN)   meloxicam (MOBIC) 15 MG tablet Take 15 mg by mouth daily as needed.   Nintedanib (OFEV ) 100 MG CAPS Take 1 capsule (100 mg total) by mouth 2 (two) times daily. **note dose decrease**  nitroGLYCERIN  (NITROSTAT ) 0.4 MG SL tablet PLACE 1 TAB UNDER TONGUE EVERY 5 MINS, UP TO 3 DOSES AS NEEDED FOR CHEST PAIN- CALL 911. (Patient taking differently: PLACE 1 TAB UNDER  TONGUE EVERY 5 MINS, UP TO 3 DOSES AS NEEDED FOR CHEST PAIN- CALL 911. Taken as needed)   tamsulosin (FLOMAX) 0.4 MG CAPS capsule Take by mouth.   docusate sodium (COLACE) 100 MG capsule Take 100 mg by mouth 2 (two) times daily. (Patient not taking: Reported on 05/05/2024)   Facility-Administered Encounter Medications as of 05/05/2024  Medication   0.9 %  sodium chloride  infusion    Vitals:   05/05/24 1007  BP: 119/78  Pulse: 62  Temp: 98.2 F (36.8 C)  Height: 5' 10 (1.778 m)  Weight: 143 lb (64.9 kg)  SpO2: 98%  TempSrc: Oral  BMI (Calculated): 20.52     Physical Exam MEASUREMENTS: Weight- 143. GEN: No acute distress CV: Regular rate and rhythm, no murmurs LUNGS: Clear to auscultation bilaterally, normal respiratory effort SKIN JOINTS: Warm and dry, no rash    Data Reviewed: Imaging: High resolution CT 12/25/2022- Interstitial lung disease right greater than left and probable UIP pattern.  Stable pulmonary nodule measuring 6 mm, thoracic aorta 4.2 cm, aortic, coronary atherosclerosis.  CTA 09/11/2023- Stable appearance of pulmonary fibrosis, stable pulmonary nodule, 3.8 cm thoracic aorta I have reviewed the images personally.  PFTs: 02/27/2023 FVC 3.42 [97%], FEV1 3.31 [135%], F/F97, TLC 6.45 [99%], DLCO 25.17 [114%] Normal test  Labs: CTD serologies 03/17/2023- Negative  Cardiac: Echocardiogram 10/10/2022-LVEF 55-60%, normal RV systolic size and function.  TR is inadequate for assessing PA pressure, TAPSE 1.6 cm Assessment & Plan Idiopathic pulmonary fibrosis Discussed at multidisciplinary conference January 2025.  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  Initial dose of 150 mg twice daily caused severe diarrhea, reduced to 100 mg once daily He tolerates this dose well, avoiding dairy products to manage diarrhea. Liver  function tests and BNP are normal, indicating no significant fluid buildup or liver strain. The current dose is effective, though not as optimal as 150 mg, which may not be tolerated. - Continue nintedanib 100 mg twice daily.  Labs including hepatic panel, BNP were normal this week. - Will order CT scan and PFTs in April or May 2026 to assess disease progression.  Diarrhea due to medication Diarrhea is a side effect of nintedanib. He manages it with Imodium and dietary modifications, specifically avoiding dairy products. He has tried various methods to manage diarrhea, including carob powder, but found it unpalatable. - Continue using Imodium as needed for diarrhea. - Continue avoiding dairy products to manage diarrhea.   Lung Nodules Stable, likely benign lung nodules identified on CT chest. -Continue annual CT chest surveillance to monitor nodules.  Coronary Artery Disease History of coronary artery disease with stent placement in 2000. -Continue current management.  Recommendations: Continue nintedanib at lower dose Follow-up CT scan Continue management of diarrhea with Imodium, dietary fiber, probiotics Continue therapeutic drug monitoring  Lonna Coder MD [facility] 05/05/2024, 10:18 AM  CC: Leonel Cole, MD    "

## 2024-05-13 ENCOUNTER — Ambulatory Visit: Payer: Self-pay | Admitting: Pulmonary Disease

## 2024-05-17 ENCOUNTER — Ambulatory Visit
Admission: RE | Admit: 2024-05-17 | Discharge: 2024-05-17 | Disposition: A | Source: Ambulatory Visit | Attending: Pulmonary Disease

## 2024-05-17 DIAGNOSIS — Z5181 Encounter for therapeutic drug level monitoring: Secondary | ICD-10-CM

## 2024-05-17 DIAGNOSIS — J849 Interstitial pulmonary disease, unspecified: Secondary | ICD-10-CM

## 2024-05-31 ENCOUNTER — Other Ambulatory Visit: Payer: Self-pay | Admitting: Pulmonary Disease

## 2024-05-31 DIAGNOSIS — J849 Interstitial pulmonary disease, unspecified: Secondary | ICD-10-CM

## 2024-06-02 NOTE — Telephone Encounter (Signed)
 Refill sent for OFEV  to [facility]  Dose: 100mg  PO BID  Last OV: 05/05/24 Provider: Dr. Theophilus Pertinent labs: LFTs wnl 05/03/24  Next OV: 09/22/24  Randy [name], PharmD, BCPS Clinical Pharmacist  [facility]

## 2024-06-03 ENCOUNTER — Telehealth: Payer: Self-pay

## 2024-06-03 ENCOUNTER — Telehealth: Payer: Self-pay | Admitting: *Deleted

## 2024-06-03 NOTE — Telephone Encounter (Signed)
 Copied from CRM [id number]. Topic: Clinical - Prescription Issue >> Jun 02, 2024 11:58 AM Russell PARAS wrote: Reason for CRM:   Pt's daughter, Charlies, is contacting clinic regarding pt's PAP for OFEV  is expiring in 06/2024. She received an email from the PAP program and wants to know if she renews his application through the email or the office.  Requested call back  CB#  [phone]  ATC X1. LMTCB

## 2024-06-03 NOTE — Telephone Encounter (Signed)
 Please advise.  Copied from CRM [id number]. Topic: Clinical - Prescription Issue >> Jun 02, 2024 11:58 AM Russell PARAS wrote: Reason for CRM:   Pt's daughter, Charlies, is contacting clinic regarding pt's PAP for OFEV  is expiring in 06/2024. She received an email from the PAP program and wants to know if she renews his application through the email or the office.  Requested call back  CB#  [phone]  Dayne Sherry RAMAN, RPH-CPP    07/02/23 11:39 AM Note Enrolled patient into PAF grant for copay:   Award Period: 01/03/2023 - 07/01/2024 ID: [id number] BIN: [id number] PCN: [id number] Group: [id number] For pharmacy inquiries, contact PDMI at [phone].

## 2024-06-03 NOTE — Telephone Encounter (Signed)
 Copied from CRM [id number]. Topic: Clinical - Lab/Test Results >> Jun 02, 2024 11:56 AM Russell PARAS wrote: Reason for CRM:   Pt's daughter Charlies is contacting office regarding his recent CT of chest. She is wanting to know if someone could review results prior to his appt in 09/2024. Advised once the provider reviewed the results, nurse would reach out to review them with pt.   Daughter requested if clinic could call her to review results, due to her dad having difficulty hearing  CB#  [phone]  Dr Theophilus, please advise

## 2024-06-03 NOTE — Telephone Encounter (Signed)
 Called regarding CRM below, No answer, LVMTVB

## 2024-06-04 ENCOUNTER — Other Ambulatory Visit (HOSPITAL_COMMUNITY): Payer: Self-pay

## 2024-06-04 NOTE — Telephone Encounter (Signed)
 Pt calling the office regarding OFEV .  Please contact the pt.

## 2024-06-10 ENCOUNTER — Other Ambulatory Visit (HOSPITAL_COMMUNITY): Payer: Self-pay

## 2024-06-10 NOTE — Telephone Encounter (Signed)
 Sending message to pharmacy team to look into patient assistance for Ofev .

## 2024-06-10 NOTE — Telephone Encounter (Signed)
 Pt using grant for Ofev , has had successful claim for this year. Will run test claim on 1/26 to see if he's met oop max, if not can reapply for grant once it expires

## 2024-06-11 NOTE — Telephone Encounter (Signed)
 Returned call to Charlies - preferred number to reach her is [phone].  Explained that grant will retrospectively cover cost of Ofev  copay, but we cannot sign him up for new grant until current grant expires in February.   Pharmacy team will contact her in February. She requests we call her as she handles her dad's medications.

## 2024-06-11 NOTE — Telephone Encounter (Signed)
 Returned call to Charlies - preferred number to reach her is [phone].   Mr. Full paid >$2000 for first fill of Ofev  in 2026.  Explained that grant will retrospectively cover cost of Ofev  copay, but we cannot sign him up for new grant until current grant expires in February.    Pharmacy team will contact her in February. She requests we call her as she handles her dad's medications.

## 2024-06-11 NOTE — Telephone Encounter (Signed)
 See separate telephone thread 06/03/24:   Jenny Monette BIRCH, [facility]    06/10/24 10:15 AM Note Pt using grant for Ofev , has had successful claim for this year. Will run test claim on 1/26 to see if he's met oop max, if not can reapply for grant once it expires     Called patient's daughter to update - patient's daughter, Charlies, reports that her dad paid >$2000 on his first fill of Ofev  this year on credit card. [employer] funds were maxed out last year per daughter. I advised his next copay of Ofev  will be $0 if he paid $2100 toward prescription drugs in 2026.   Daughter, Charlies, asks if grant will retrospectively cover what Mr. Goracke paid for Ofev  in January. I advised that I did not know of a grant that would retrospectively cover copays, but would look into this as she reports this was what happened last year.   She requests a call back to her at [phone].

## 2024-06-15 ENCOUNTER — Ambulatory Visit: Payer: Self-pay | Admitting: Pulmonary Disease

## 2024-06-15 DIAGNOSIS — R911 Solitary pulmonary nodule: Secondary | ICD-10-CM

## 2024-06-15 DIAGNOSIS — J849 Interstitial pulmonary disease, unspecified: Secondary | ICD-10-CM

## 2024-06-23 ENCOUNTER — Encounter: Payer: Self-pay | Admitting: Family Medicine

## 2024-09-22 ENCOUNTER — Ambulatory Visit: Admitting: Pulmonary Disease

## 2024-09-22 ENCOUNTER — Encounter

## 2024-11-10 ENCOUNTER — Other Ambulatory Visit
